# Patient Record
Sex: Female | Born: 1960 | Hispanic: No | Marital: Married | Smoking: Never smoker
Health system: Southern US, Community
[De-identification: ages and names within clinical notes are randomized; demographics above are authoritative.]

## PROBLEM LIST (undated history)

## (undated) HISTORY — PX: LUMBAR SPINE SURGERY: SHX701

---

## 2008-09-22 ENCOUNTER — Ambulatory Visit: Payer: Self-pay | Admitting: Sports Medicine

## 2008-09-22 DIAGNOSIS — M722 Plantar fascial fibromatosis: Secondary | ICD-10-CM

## 2008-09-22 DIAGNOSIS — M2170 Unequal limb length (acquired), unspecified site: Secondary | ICD-10-CM

## 2008-10-08 ENCOUNTER — Ambulatory Visit: Payer: Self-pay | Admitting: Sports Medicine

## 2008-10-08 DIAGNOSIS — M76899 Other specified enthesopathies of unspecified lower limb, excluding foot: Secondary | ICD-10-CM

## 2008-10-21 ENCOUNTER — Ambulatory Visit: Payer: Self-pay | Admitting: Sports Medicine

## 2008-11-12 ENCOUNTER — Ambulatory Visit: Payer: Self-pay | Admitting: Sports Medicine

## 2008-11-12 DIAGNOSIS — Q667 Congenital pes cavus, unspecified foot: Secondary | ICD-10-CM | POA: Insufficient documentation

## 2008-12-02 ENCOUNTER — Telehealth (INDEPENDENT_AMBULATORY_CARE_PROVIDER_SITE_OTHER): Payer: Self-pay | Admitting: *Deleted

## 2008-12-03 ENCOUNTER — Ambulatory Visit: Payer: Self-pay | Admitting: Sports Medicine

## 2008-12-15 ENCOUNTER — Telehealth: Payer: Self-pay | Admitting: Sports Medicine

## 2009-02-11 ENCOUNTER — Ambulatory Visit: Payer: Self-pay | Admitting: Sports Medicine

## 2009-02-11 DIAGNOSIS — M629 Disorder of muscle, unspecified: Secondary | ICD-10-CM

## 2009-02-11 DIAGNOSIS — M7540 Impingement syndrome of unspecified shoulder: Secondary | ICD-10-CM | POA: Insufficient documentation

## 2009-04-21 ENCOUNTER — Ambulatory Visit: Payer: Self-pay | Admitting: Sports Medicine

## 2009-04-21 DIAGNOSIS — R209 Unspecified disturbances of skin sensation: Secondary | ICD-10-CM | POA: Insufficient documentation

## 2009-04-21 DIAGNOSIS — M25529 Pain in unspecified elbow: Secondary | ICD-10-CM | POA: Insufficient documentation

## 2009-10-15 ENCOUNTER — Ambulatory Visit: Payer: Self-pay | Admitting: Sports Medicine

## 2009-10-15 DIAGNOSIS — M7742 Metatarsalgia, left foot: Secondary | ICD-10-CM

## 2009-10-15 DIAGNOSIS — M7741 Metatarsalgia, right foot: Secondary | ICD-10-CM

## 2009-12-01 ENCOUNTER — Encounter: Payer: Self-pay | Admitting: Sports Medicine

## 2009-12-07 ENCOUNTER — Encounter (INDEPENDENT_AMBULATORY_CARE_PROVIDER_SITE_OTHER): Payer: Self-pay | Admitting: *Deleted

## 2009-12-10 ENCOUNTER — Encounter: Payer: Self-pay | Admitting: Sports Medicine

## 2009-12-11 ENCOUNTER — Ambulatory Visit (HOSPITAL_BASED_OUTPATIENT_CLINIC_OR_DEPARTMENT_OTHER): Admission: RE | Admit: 2009-12-11 | Discharge: 2009-12-11 | Payer: Self-pay | Admitting: Orthopedic Surgery

## 2010-05-31 ENCOUNTER — Ambulatory Visit: Payer: Self-pay | Admitting: Sports Medicine

## 2010-05-31 DIAGNOSIS — M25539 Pain in unspecified wrist: Secondary | ICD-10-CM

## 2010-05-31 DIAGNOSIS — S83509A Sprain of unspecified cruciate ligament of unspecified knee, initial encounter: Secondary | ICD-10-CM

## 2010-06-28 ENCOUNTER — Encounter (INDEPENDENT_AMBULATORY_CARE_PROVIDER_SITE_OTHER): Payer: Self-pay | Admitting: *Deleted

## 2010-06-28 DIAGNOSIS — M5136 Other intervertebral disc degeneration, lumbar region: Secondary | ICD-10-CM | POA: Insufficient documentation

## 2010-06-30 ENCOUNTER — Encounter: Payer: Self-pay | Admitting: Sports Medicine

## 2010-07-12 ENCOUNTER — Ambulatory Visit
Admission: RE | Admit: 2010-07-12 | Discharge: 2010-07-12 | Payer: Self-pay | Source: Home / Self Care | Attending: Sports Medicine | Admitting: Sports Medicine

## 2010-07-26 ENCOUNTER — Encounter: Payer: Self-pay | Admitting: Sports Medicine

## 2010-07-27 NOTE — Miscellaneous (Signed)
Summary: ORTHO APPT  PT CALLED STATING SHE SAW ORTHO DOC IN [facility] AND WAS DX'ED WITH HIGH GRADE ACL; POST MED MENISCUS HORN TEAR; AND SMALL SPLIT PCL TEAR.  THEIR OFFICE COULD NOT SEE HER UNTIL JULY 28TH AND SHE WANTS SURGERY BEFORE THEN. PER FIELDS, PT CAN SEE DOC AT [facility]. PT NOW HAS APPT ON: THURS, JUNE 16TH AT 3 PM WITH DR Jonny Ruiz GRAVES AT 3 PM AT [facility]. PT AGREED WITH APPT TIME AND DATE

## 2010-07-27 NOTE — Assessment & Plan Note (Signed)
Summary: HIP PAIN,MC   Vital Signs:  Patient profile:   50 year old female Height:      64 inches Weight:      125 pounds BMI:     21.53 BP sitting:   97 / 63  Vitals Entered By: Lillia Pauls CMA (October 15, 2009 8:47 AM)  History of Present Illness: Pt presents for new complaint of right hip pain, h/o left hamstring tightness, and right elbow pain.  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  Patient denies new injury. States right hip posteriorly has been bothering her.  Worse with prolonged sitting, worse with stops and starts when playing tennis. Not running hills to bring this on.  No known inciting factors. Was doing some stretches (piriformis demonstrated) that helped initially but not feeling it now. Some numbness occasionally around area of pain. No bowel or bladder dysfunction. H/o lumbar spine surgery in 1980s for 'kissing spine' between L4-S1 Has been doing home exercises to strengthen abductors and flexors and doing well with these. Had left hamstring calcification proximally felt to be irritating sciatic nerve.  This side is better but still feels tight.  Is right handed Has known h/o medial epicondylitis - flareup prior to last visit and feels this is similar.  Allergies (verified): No Known Drug Allergies  Physical Exam  General:  alert, well-developed, and well-nourished.   Msk:  Walks without a limp.  R hip/back: No Gross deformity, swelling, bruising. No midline or paraspinal TTP.  No greater trochanter TTP.  Very mild TTP piriformis on right side. FROm hip and low back.  Negative log roll.  Negative SLRs. Strength 5/5 all muscle groups except 5-/5 with sartorius on right. Stretch felt in same area of pain with pretzel stretch and opposite knee to chest (r knee, left chest). Reflexes 2+ equal in patellar and achilles tendons. Sensation intact to light touch bilaterally.  Right Elbow: No bony abnormalities, edema or bruising. Well healed scar.  Full ROM  with flexion, extension, supination and pronation 5/5 strength with resisted flexion, extension, supination and pronation Mild medial epicondyle TTP Additional Exam:  U/S of the right prox hamstring/buttock showed no obvious muscle tears.  Small calcification without increased doppler flow near ischium.  Images saved for documentation.   Impression & Recommendations:  Problem # 1:  LEG PAIN, RIGHT (ICD-729.5) Assessment New More muscular in nature without obvious muscle tear by ultrasound.  Pretzel stretch and piriformis stretch to try to loosen this spasm up around sciatic nerve.  Standing hip rotations, lunges in various directions to help with strengthening.  Discussed dynamic exercises (yoga).    Problem # 2:  ELBOW PAIN, RIGHT (ICD-719.42) Assessment: Unchanged Exercises demonstrated.  Had discussed counterforce brace in past.  Activities as tolerated.  Complete Medication List: 1)  Lidoderm 5 % Ptch (Lidocaine) .... Apply patch to area up to 12 hours a day 2)  Tramadol Hcl 50 Mg Tabs (Tramadol hcl) .... Take one tab qid as needed pain 3)  Diclofenac Sodium 75 Mg Tbec (Diclofenac sodium) .... Take one tab two times a day 4)  Gabapentin 300 Mg Caps (Gabapentin) .... One tab by mouth qhs  Patient Instructions: 1)  Crossover, straight, and side lunges - 3 sets of 8 once a day. 2)  Knee to chest stretch 3 x 30 seconds once a day. 3)  Standing hip rotations 3 x 10 once a day. 4)  Pretzel stretch (leg off side of couch) 3 x 30 seconds once a day.  5)  Yoga would be a good idea - some classes for some more dynamic stretching. 6)  Hamstring try modified lunges - stright/ xover/ step outward- 7)  RT forearm 8)  rolls 9)  wrist extension - down slow and up fast 10)  tennis simulation w light weight

## 2010-07-27 NOTE — Consult Note (Signed)
Summary: [facility]  [facility]   Imported By: Marily Memos 12/23/2009 08:41:56  _____________________________________________________________________  External Attachment:    Type:   Image     Comment:   External Document

## 2010-07-27 NOTE — Assessment & Plan Note (Signed)
Summary: L WRIST PAIN,CHECK HER ORTHTOICS,MC   History of Present Illness: Hx of ACL surg - June 17-has been progressively increasing activities for the last 3-4 months. Now doing plyometrics and water aerobics. Has been semi-compliant with PT; going 2-3 times per month vs 2-3 times per week secondary to cost issues. Overall PT has been effective in leg function. did Pt once monthly Plantar Fasciitis: Pt reports recurrence in bilateral arch pain for last 2-3 months. Has been using custom orthotics with physical acitivities. However, orthotics do not fit non-athletic shoes which pt uses on more frequent basis. Pain/irritation R>L. No pain radiation to distal toes, distal parasthesias.  Wrist Pain: Pt reports L wrist pain over last 2-3 months. Pain most prominent w/ lateral hand movement with water exercises. Pain also prominent ulnar deviation. No noted finger numbness, or radiation of pain to lateral/medial elbow.   Allergies: No Known Drug Allergies  Physical Exam  General:  Well-developed,well-nourished,in no acute distress; alert,appropriate and cooperative throughout examination Msk:  L Wrist: no visible effusions, erythema. Full ROM w/ wrist flexion and extension. + pain w/ ulnar deviation. Intrinsic muscle function WNL.  no parasthesias noted  LE: + pronation bilaterally with standing Neck:  Full ROM w/ lateral movement.    RT  knee exam shows no effusion; stable ligaments; negative Mcmurray's and provocative meniscal tests; non painful patellar compression; patellar and quadriceps tendons unremarkable  LT in comparison shows scars from ACL surgery and actually has tighter lachman test than RT still has VMO atrophy and has marked hip abduction weakness  Orthotics look in good repair and support archs      Impression & Recommendations:  Problem # 1:  WRIST PAIN, LEFT (ICD-719.43)  will use wrist loop for water exercises  use at leat 6 weeks  see if less sxs over TFCC  area dound to have + ulnar variance by Dr Luiz Blare  Orders: Upper Limb Ortosis (wrist loop, arm band, finger splint) (Z6109)  Problem # 2:  ACL TEAR, LEFT KNEE (ICD-844.2) has made great progress  however needs to push HEP to build up strength in VMO and abductor  given program  Problem # 3:  FASCIITIS, PLANTAR (ICD-728.71)  Her updated medication list for this problem includes:    Diclofenac Sodium 75 Mg Tbec (Diclofenac sodium) .Marland Kitchen... Take one tab two times a day  With chronic probs keep up custom orthotics and add arch supports to other shoes  Complete Medication List: 1)  Lidoderm 5 % Ptch (Lidocaine) .... Apply patch to area up to 12 hours a day 2)  Tramadol Hcl 50 Mg Tabs (Tramadol hcl) .... Take one tab qid as needed pain 3)  Diclofenac Sodium 75 Mg Tbec (Diclofenac sodium) .... Take one tab two times a day 4)  Gabapentin 300 Mg Caps (Gabapentin) .... One tab by mouth qhs  Patient Instructions: 1)  Neck 2)  3x wk - isometrics exercise flex. extension, rotation and lat bend 3)  5 exercise x count of 10 4)  posture exercises 5)  LOW Back 6)  3 x week - pick 2 flexion and 2 extension 7)  2 Hip exercises as demo on sheet daily 8)  Knee daily 9)  SLR with foot rotated outward 10)  build that to 3 sets of 15 with 2lb ankle 11)  press downs 12)  Quad extensions with ball between legs 13)  6 weeks RTC   Orders Added: 1)  Est. Patient Level IV [60454] 2)  Upper Limb Ortosis (wrist  loop, arm band, finger splint) [L3999]

## 2010-07-29 NOTE — Miscellaneous (Signed)
Summary: XRAY ORDERS  Clinical Lists Changes  Problems: Added new problem of LUMBAGO (ICD-724.2) Orders: Added new Test order of Radiology other (Radiology Other) - Signed

## 2010-07-29 NOTE — Assessment & Plan Note (Signed)
Summary: POST XRAY,MC   Vital Signs:  Patient profile:   50 year old female Height:      64 inches Weight:      120 pounds BP sitting:   92 / 61  Vitals Entered By: Lillia Pauls CMA (July 12, 2010 9:43 AM)  History of Present Illness: Pt presents to clinic for follow up of low back pain post x-ray.  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 LBP radiates into left hip  when sitting will get tightness into both ant quads  Note seh had laminectomy in 1985 at L5/S1 Now more back pain w tennis as well  wrist pain - not a specific injury but thinks may relate to water aerobics teaching XRay by dr Luiz Blare showed + ulnar variance  ACL tear left feels strong now and is pain free no real loss of motion      Allergies: No Known Drug Allergies  Physical Exam  General:  Well-developed,well-nourished,in no acute distress; alert,appropriate and cooperative throughout examination Msk:  SI joints move well Rthip ER 60 and IR 30 Lthip ER 45 and IR 20  Neg straight leg raise RT FABER on Rt is tight FABER extremely tight on Lt Hip flexion strong bilat Hip abduction strong bilat Seated Hip rotation on rt 30 IR and 40 ER Seated Hip rotation Lt 15 IR and 40 ER Tenderness on palpation at L1-L2  Left hand atrophy hypothenar ridge ulnar side Pain over Lt TFCC Limited flexion of lt wrist Full extesion lt wrist    Additional Exam:  MSK [place] Left wrist is scanned All ext cmpts are normal x 6 There is persistent fluid and bull's eye noted around ECU tendon TFCC area is scanned The ligament is partially calcified I cannot detedt a tear in actual meniscus but this is tough to viz w US   Impression & Recommendations:  Problem # 1:  LUMBAGO (ICD-724.2)  Her updated medication list for this problem includes:    Tramadol Hcl 50 Mg Tabs (Tramadol hcl) .Marland Kitchen... Take one tab qid as needed pain    Diclofenac Sodium 75 Mg Tbec (Diclofenac sodium) .Marland Kitchen... Take one  tab two times a day  I think she needs to do a regular trial of tramadol and gabapentin to see if she gets less LBP and hip sxs Review of Xrays reveals signidicant loss of space and sclerosis at L5/S1 There is a large osteophyte and loss of disk space at L1/L2 as well some facet joint DDD noted lower lumbar  need to review if she has had any hip or pelvis films  suspect Sxs relate to DDD and DJD and as such will limit exercises that tend to worsen sxs Moderate tennis to tolerabel level and change overheaed and serve to put less stress on back  let me know how she respon ds to more reg med use  Problem # 2:  WRIST PAIN, LEFT (ICD-719.43)  Orders: Upper Limb Ortosis (wrist loop, arm band, finger splint) (J1914) [place] LIMITED ([id number])  I am concerned that she gets persistent irritation from + ulna good not prove or R/O TFCC injury but most pain at that location  review Xrays from DR GRaves  Problem # 3:  ACL TEAR, LEFT KNEE (ICD-844.2) This seems to have done very well  encouraged to see Graves one more time to see if any additional advice before competing at high level tennis  Complete Medication List: 1)  Lidoderm 5 % Ptch (  Lidocaine) .... Apply patch to area up to 12 hours a day 2)  Tramadol Hcl 50 Mg Tabs (Tramadol hcl) .... Take one tab qid as needed pain 3)  Diclofenac Sodium 75 Mg Tbec (Diclofenac sodium) .... Take one tab two times a day 4)  Gabapentin 300 Mg Caps (Gabapentin) .... One tab by mouth qhs  Patient Instructions: 1)  Find 2-3 stretches that make your low back feel better- ie- knee to chest, and back flexion - do these daily 2)  Take gabapentin 300 mg at bedtime 3)  Take tramadol twice daily, ok to supplement with aleve or advil 4)  Stop superman exercise    Orders Added: 1)  Upper Limb Ortosis (wrist loop, arm band, finger splint) [L3999] 2)  Est. Patient Level IV [52841] 3)  [place] LIMITED [32440]  Appended Document: POST XRAY,MC review of wrist Xray left   + ulnar variance with cystic change in ulnar styloid knees show mild medial sclerosis but good placement of ACL graft AP Pelvis - I do not see significant DJD of either hip/ mild changes at best  called and left message

## 2010-08-04 NOTE — Letter (Signed)
Summary: Generic Letter  [facility]  [address]   [place], [place] [address]   Phone: [phone]  Fax: [phone]    07/26/2010 [name], MD [facility] Fax [phone]   Dear Annette Stable:  Please see my patient:  Marie Hood [address] 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  She has a lot of ulnar side wrist pain in non dominant hand that hurts her mostly with water aerobics.  Dr Luiz Blare had seen her as well and found very + ulnar variance with cytic change at distal ulna on Xray.  She has not responded to conservative care so both he and I felt she would warrant consideration of wrist surgery.  I will send office notes and she will bring Xrays.  I very much appreciate your opinion regarding her care.          Sincerely,  Vincent Gros MD

## 2010-09-12 LAB — POCT HEMOGLOBIN-HEMACUE: Hemoglobin: 14.7 g/dL (ref 12.0–15.0)

## 2011-10-03 ENCOUNTER — Ambulatory Visit (INDEPENDENT_AMBULATORY_CARE_PROVIDER_SITE_OTHER): Payer: Managed Care, Other (non HMO) | Admitting: Sports Medicine

## 2011-10-03 ENCOUNTER — Encounter: Payer: Self-pay | Admitting: Sports Medicine

## 2011-10-03 VITALS — BP 98/66 | HR 66

## 2011-10-03 DIAGNOSIS — M23203 Derangement of unspecified medial meniscus due to old tear or injury, right knee: Secondary | ICD-10-CM | POA: Insufficient documentation

## 2011-10-03 DIAGNOSIS — S86819A Strain of other muscle(s) and tendon(s) at lower leg level, unspecified leg, initial encounter: Secondary | ICD-10-CM

## 2011-10-03 DIAGNOSIS — S838X9A Sprain of other specified parts of unspecified knee, initial encounter: Secondary | ICD-10-CM

## 2011-10-03 DIAGNOSIS — M79609 Pain in unspecified limb: Secondary | ICD-10-CM

## 2011-10-03 NOTE — Patient Instructions (Signed)
It was great to see you today! I want you to wear the knee brace while playing and for after playing. Warm up on either a stationary bike or using some straight leg raises.

## 2011-10-03 NOTE — Progress Notes (Signed)
Patient ID: Marie Hood, female   DOB: 1960/07/11, 51 y.o.   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  Pt reports that she does not typically do any type of distance running.  She decided to do 5K at the urging of her daughter. She ran a 5K approximately 9 days ago and then proceeded to play tennis later that day and the following day. She reports that the day following the race she awoke with primarily right knee pain and swelling. The pain was located behind her knee and on the medial side. It was made worse by straightening her knee. She has continued most of her normal activities despite the discomfort and has been icing it intermittently since then. She continues to have problems with pain and swelling today, although it is somewhat better than a week ago.  Objective: General: Appropriate white female, in no distress Extremities: On inspection there is no obvious asymmetry between the knees. There is a slight amount of swelling in the left medial posterior knee but otherwise there are no obvious joint effusions. Right hip: 130 degrees of flexion, full internal/external rotation Left hip: 120 degrees flexion, full internal/external rotation Left Knee: Full extension, 135 degrees flexion, no joint instability, good MCL/LCL strength Right Knee: Full Extension, 135 degrees flexion, no joint instability, good MCL/LCL strength, small amount of swelling posteriorly in the popliteal fossa and slight tenderness over the medial joint line.  Assessment/Plan: Medial meniscus bruise from overuse/running on hard surface.  Will treat with full knee brace, ice and light duty for the next 1-2 weeks.

## 2013-07-29 ENCOUNTER — Ambulatory Visit (INDEPENDENT_AMBULATORY_CARE_PROVIDER_SITE_OTHER): Payer: Managed Care, Other (non HMO) | Admitting: Family Medicine

## 2013-07-29 ENCOUNTER — Encounter: Payer: Self-pay | Admitting: Family Medicine

## 2013-07-29 VITALS — BP 92/61 | Ht 64.0 in | Wt 120.0 lb

## 2013-07-29 DIAGNOSIS — M1610 Unilateral primary osteoarthritis, unspecified hip: Secondary | ICD-10-CM

## 2013-07-29 DIAGNOSIS — M629 Disorder of muscle, unspecified: Secondary | ICD-10-CM

## 2013-07-29 DIAGNOSIS — M169 Osteoarthritis of hip, unspecified: Secondary | ICD-10-CM

## 2013-07-29 DIAGNOSIS — M259 Joint disorder, unspecified: Secondary | ICD-10-CM

## 2013-07-29 NOTE — Progress Notes (Signed)
   Subjective:    Patient ID: Margurite Auerbachanette Moorefield, female    DOB: 03/27/1961, 53 y.o.   MRN: [id number]  HPI  3 issues #1. Left knee pain that started to half days ago while playing tennis. Felt a sudden "ping or zing" in left anterior knee. Painful. Did not fall, knee did not give way. Did not have significant swelling but has noticed one small area of swelling about the size of a quarter on her anterior left knee. Is bothering her a little bit and she's concerned because she has an upcoming national tennis term PERTINENT  PMH / PSH: Prior surgery left knee anterior cruciate ligament repair with patellar tendon transfer. 2011 with Dr. Luiz BlareGraves. Nonsmoker   #2. Right knee has been hurting off and on over the last couple of weeks, anterior medial. It stings and has a tingly feeling there is sometimes painful. No swelling, no locking or giving way. No specific injury. #3. Left hip pain. This has been going on for some time. Has been doing L. the etc. to keep her self flexible. Has noticed that her external rotation on the left hip his now fairly limited and has questions about what she should do to prevent it from worsening. Interestingly on her right hip for internal rotation is an issue  Review of Systems No fever, sweats, chills, unusual weight change.    Objective:   Physical Exam  Vital signs are reviewed GENERAL: Well-developed female no acute distress Hips: Limited external rotation left hip to about 30. Right hip externally rotates fully but in general rotation is limited to about 20. Flexion is full at his extension bilaterally. Greater trochanters are nontender.  Knees: Knees are symmetrical. There is no effusion, erythema or warmth noted of either. Distally bilateral calves are soft and she is neurovascularly intact status. Left knee right at the joint line laterally she has a small palpable nickel-sized reducible mass. Not particularly tender. Lachman's is a little tighter on the left where  she had surgery but bilaterally quite stable in points. Negative McMurray. Negative apprehension sign. No true joint line tenderness on either knee. ULTRASOUND: Both knees are evaluated and had normal patellar and quadriceps tendons. Medial and lateral menisci are seen fairly well and appeared to be intact without any tears or effusion. The left knee lateral retinaculum right over the articular surface reveals a small tear with some herniation of fat through the tear. There some increased Doppler activity over this area and it coincides with the small nickel sized deformity.      Assessment & Plan:  #1. Knee pain. She has a small joint capsule tear lateral retinaculum left knee. I think if she uses her knee brace and does some icing she can continue to play. Right knee appears normal so I suspect her pain is maybe some mild DJD. Some this may be related to her hip issues. #2. Hips: Likely DJD with fairly significant red loss of external rotation on the left and internal rotation on the right. We talked about continuing to do flexibility exercises.

## 2014-03-11 ENCOUNTER — Ambulatory Visit (INDEPENDENT_AMBULATORY_CARE_PROVIDER_SITE_OTHER): Payer: Managed Care, Other (non HMO) | Admitting: Sports Medicine

## 2014-03-11 ENCOUNTER — Encounter: Payer: Self-pay | Admitting: Sports Medicine

## 2014-03-11 VITALS — BP 99/67 | HR 64 | Ht 64.0 in | Wt 120.0 lb

## 2014-03-11 DIAGNOSIS — R269 Unspecified abnormalities of gait and mobility: Secondary | ICD-10-CM

## 2014-03-11 DIAGNOSIS — R2689 Other abnormalities of gait and mobility: Secondary | ICD-10-CM

## 2014-03-11 DIAGNOSIS — M23305 Other meniscus derangements, unspecified medial meniscus, unspecified knee: Secondary | ICD-10-CM

## 2014-03-11 DIAGNOSIS — M23203 Derangement of unspecified medial meniscus due to old tear or injury, right knee: Secondary | ICD-10-CM

## 2014-03-11 NOTE — Progress Notes (Signed)
  Marie Hood - 53 y.o. female MRN [id number]  Date of birth: Aug 27, 1960  SUBJECTIVE:  Including CC & ROS.  The patient is a very pleasant 53 year old Caucasian female who presents today for right knee pain and new orthotics to replace her broken down orthotics. 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 She reports over the past few weeks her right knee pain has progressively gotten worse although it has been present for several years. She has difficulty doing any kind of deep squats or lunges. Localizes it to the medial jointline. Has decreased her tennis activities from 3 days a week to once a week. She's been doing therapeutic modality such as body helix knee sleeve and icing and occasional over-the-counter Aleve. 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 She denies any locking catching or giving way. But does complain of occasional effusion particularly after aggressive.   ROS: Review of systems otherwise negative except for information present in HPI  HISTORY: Past Medical, Surgical, Social, and Family History Reviewed & Updated per EMR. Pertinent Historical Findings include: Non-smoker  DATA REVIEWED: No other data available  PHYSICAL EXAM:  VS: BP:99/67 mmHg  HR:64bpm  TEMP: ( )  RESP:   HT:5\' 4"  (162.6 cm)   WT:120 lb (54.432 kg)  BMI:20.6 KNEE EXAM:  General: well nourished Skin of LE: warm; dry, no rashes, lesions, ecchymosis or erythema. Vascular: Dorsal pedal pulses 2+ bilaterally Neurologically: Sensation to light touch lower extremities equal and intact bilaterally.  Observation: no knee effusion visualized, no previous surgical scars, patella in place, no quad muscle atrophy Palpation: moderate TTP along the medial joint line at the mid to posterior portion.  No pain along the lateral joint line Range of motion: Full ROM at the hip and very good strength in hip ADB/ ER Full knee flexion to approximately 140, full extension 0,  normal patellar tracking Ligamentous testing: ligamentous stable  Negative patella apprehension test Meniscal evaluation: Positive McMurray's test, positive thessaly's test, Normal gait  MSK [place]: Patient has a normal patella tendon. There is a small area of calcification within the quadriceps tendon with no history of discomfort. Negative for evidence of knee effusion on ultrasound exam. The medial meniscus particularly in the posterior aspect has some hypoechoic changes and splitting within the meniscus concerning for degenerative tear.  ASSESSMENT & PLAN: See problem based charting & AVS for pt instructions. Right knee degenerative medial meniscus tear: Based on the patient's history, exam, and ultrasound exam she is evidence of a medial right knee degenerative meniscus tear. Patient was given options of continuing conservative management with knee sleeve, and over-the-counter anti-inflammatories, staying active, and activity such as biking versus more aggressive activities such as x-raying or cortisone injection. Age-related continue conservative management.  Orthotic Fitting and Adjustment note: Patient was fitted for a : standard, cushioned, semi-rigid orthotic.  The orthotic was heated and afterward the patient stood on the orthotic blank positioned on the orthotic stand.  The patient was positioned in subtalar neutral position and 10 degrees of ankle dorsiflexion in a weight bearing stance.  After completion of molding, a stable base was applied to the orthotic blank.  The blank was ground to a stable position for weight bearing.  Size: 9 Base: Blue EVA Posting: None Additional orthotic padding: None  Greater than 50% of the patient's visit was conducted with face-to-face counseling for bilateral foot orthotics fitting and constructing

## 2014-03-11 NOTE — Patient Instructions (Signed)
It was great to see you today! Call the office if you have any questions [phone]  Avoid squatting and lunges type activities any greater than 30-45 deg

## 2014-04-01 ENCOUNTER — Encounter: Payer: Self-pay | Admitting: Sports Medicine

## 2014-04-01 ENCOUNTER — Ambulatory Visit (INDEPENDENT_AMBULATORY_CARE_PROVIDER_SITE_OTHER): Payer: Managed Care, Other (non HMO) | Admitting: Sports Medicine

## 2014-04-01 ENCOUNTER — Encounter: Payer: Managed Care, Other (non HMO) | Admitting: Sports Medicine

## 2014-04-01 VITALS — Ht 64.0 in | Wt 120.0 lb

## 2014-04-01 DIAGNOSIS — M79672 Pain in left foot: Secondary | ICD-10-CM

## 2014-04-01 DIAGNOSIS — M79671 Pain in right foot: Secondary | ICD-10-CM

## 2014-04-01 NOTE — Patient Instructions (Signed)
It was great to see you today! Call the office if you have any questions [phone]  If you have problems with your orthotics, please let us know.

## 2014-04-01 NOTE — Progress Notes (Signed)
  Marie Hood - 53 y.o. female MRN [id number]  Date of birth: 04/02/1961  SUBJECTIVE:  Including CC & ROS.  The patient is a very pleasant 53 year old Caucasian female who presents today for right knee pain and new orthotics specifically for her work shoes.  Pt is very active, performing activities such as tennis, personal training, and she is currently working 25-30 hours at [employer] to work.  Pt has been performing water aerobics more often to help with her medial knee pain, at which there is a concern about a degenerative medial meniscal tear.    HX of plantar fasiitis that responded to orthotics and injection HX of chronic low back pain and this has really led to her using orthotics daily as she gets back pain when she goes without them for a few hours ACL tear left repaired   ROS: Review of systems otherwise negative except for information present in HPI  HISTORY: Past Medical, Surgical, Social, and Family History Reviewed & Updated per EMR. Pertinent Historical Findings include: Non-smoker  DATA REVIEWED: No other data available  PHYSICAL EXAM:  VS: BP:   HR: bpm  TEMP: ( )  RESP:   HT:5\' 4"  (162.6 cm)   WT:120 lb (54.432 kg)  BMI:20.6 KNEE EXAM:  General: well nourished Skin of LE: warm; dry, no rashes, lesions, ecchymosis or erythema. Vascular: Dorsal pedal pulses 2+ bilaterally Neurologically: Sensation to light touch lower extremities equal and intact bilaterally.  Foot Exam:  B/L Pes Cavus, R>L  R Foot:  Splaying of toes 2-5 with collapse of the transverse arch Morton's callous present Calcaneal Varus   L Foot: 2nd Dorsum TMT Bossing  Collapse of the transverse arch, splaying of toes 2-5 Morton's callous Calcaneal Varus    ASSESSMENT & PLAN: See problem based charting & AVS for pt instructions. Foot Pain, B/L   Orthotic Fitting and Adjustment note: Patient was fitted for a : rigid, metal based orthotic  The orthotic was heated and afterward the patient  stood on the orthotic blank positioned on the orthotic stand.  The patient was positioned in subtalar 10 degrees of supination position and 10 degrees of ankle dorsiflexion in a weight bearing stance.  After completion of molding, a stable base was applied to the orthotic blank.  The blank was ground to a stable position for weight bearing.  Size: 9 Base: Red EVA  Posting: Lateral  Additional orthotic padding: Blue EVA at the heel   Greater than 50% of the patient's 40 minute visit was conducted with face-to-face counseling for bilateral foot orthotics fitting and constructing

## 2014-10-02 ENCOUNTER — Encounter: Payer: Self-pay | Admitting: Sports Medicine

## 2014-10-02 ENCOUNTER — Ambulatory Visit (INDEPENDENT_AMBULATORY_CARE_PROVIDER_SITE_OTHER): Payer: BLUE CROSS/BLUE SHIELD | Admitting: Sports Medicine

## 2014-10-02 VITALS — BP 94/51 | HR 67 | Ht 64.0 in | Wt 120.0 lb

## 2014-10-02 DIAGNOSIS — M7741 Metatarsalgia, right foot: Secondary | ICD-10-CM | POA: Diagnosis not present

## 2014-10-02 DIAGNOSIS — M7742 Metatarsalgia, left foot: Secondary | ICD-10-CM | POA: Diagnosis not present

## 2014-10-02 DIAGNOSIS — M75101 Unspecified rotator cuff tear or rupture of right shoulder, not specified as traumatic: Secondary | ICD-10-CM

## 2014-10-02 DIAGNOSIS — M7541 Impingement syndrome of right shoulder: Secondary | ICD-10-CM

## 2014-10-02 NOTE — Assessment & Plan Note (Signed)
She will continue with custom orthotics  Related metatarsal padding to both  Related foam padding across metatarsal fat pad area  This was more comfortable with walking and she will test over the next month

## 2014-10-02 NOTE — Assessment & Plan Note (Signed)
This relates to lots of years tennis  I suggested postural exercises Scapular stabilization exercises Keep up rotator cuff strength  Reevaluate if not improving

## 2014-10-02 NOTE — Progress Notes (Signed)
Patient ID: Marie Hood, female   DOB: 05/15/1961, 54 y.o.   MRN: [id number]  Pt with foot pain worse on left We have her in custom orthotics for tennis and a dress orthotic for work She gets significant left forefoot pain The orthotics have helped her overall foot pain in her previous problems with plantar fasciitis She has a cavus foot  Another issue is some chronic neck and right shoulder pain This will trigger a tension headache  She plays a lot of tennis and works on her feet at [employer] about 6 hours a day  Physical exam Muscular female in no acute distress BP 94/51 mmHg  Pulse 67  Ht 5\' 4"  (1.626 m)  Wt 120 lb (54.432 kg)  BMI 20.59 kg/m2  Compared to prior visit she continues to have a cavus foot but has more callusing in the forefoot area of the left foot On right foot she has callus right over the fourth metatarsal head Longitudinal arch is preserved Transverse arch is showing flattening  Neck range of motion was full and nonpainful Right trapezius showed spasm Shoulder with full range of motion  her right shoulder has a positive empty can in a positive Hawkins test

## 2014-10-22 ENCOUNTER — Ambulatory Visit: Payer: Managed Care, Other (non HMO) | Admitting: Sports Medicine

## 2017-06-13 ENCOUNTER — Ambulatory Visit: Payer: BLUE CROSS/BLUE SHIELD | Admitting: Sports Medicine

## 2017-06-13 ENCOUNTER — Encounter: Payer: Self-pay | Admitting: Sports Medicine

## 2017-06-13 DIAGNOSIS — Q667 Congenital pes cavus, unspecified foot: Secondary | ICD-10-CM

## 2017-06-13 DIAGNOSIS — R2689 Other abnormalities of gait and mobility: Secondary | ICD-10-CM | POA: Diagnosis not present

## 2017-06-13 DIAGNOSIS — M7741 Metatarsalgia, right foot: Secondary | ICD-10-CM

## 2017-06-13 DIAGNOSIS — M7742 Metatarsalgia, left foot: Secondary | ICD-10-CM

## 2017-06-13 NOTE — Assessment & Plan Note (Signed)
Has been in orthotics with good success for 10 yrs +  Needs to cont. These long term

## 2017-06-13 NOTE — Assessment & Plan Note (Signed)
She will test using MT pad on 1 orthotic and none on other  If not needed we may be able to stop pads

## 2017-06-13 NOTE — Progress Notes (Signed)
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 The orthotic was heated and afterward the patient stood on the orthotic blank positioned on the orthotic stand. The patient was positioned in subtalar neutral position and 10 degrees of ankle dorsiflexion in a weight bearing stance. After completion of molding, a stable base was applied to the orthotic blank. The blank was ground to a stable position for weight bearing. Size: 9 Red EVA Base: blue EVA mediums Posting: none - we held MT pads as the cushion felt good Additional orthotic padding: O  Patient was fitted for a : standard, cushioned, semi-rigid orthotic. The orthotic was heated and afterward the patient stood on the orthotic blank positioned on the orthotic stand. The patient was positioned in subtalar neutral position and 10 degrees of ankle dorsiflexion in a weight bearing stance. After completion of molding, a stable base was applied to the orthotic blank. The  blank was ground to a stable position for weight bearing. Size: 9 Thin "dress orthotic" Base: tan medial to lateral taper EVA mod Posting: MT pad on left Additional orthotic padding:O  After completion of these 2 pairs of orthotics she was comfortable walking in each pain Good control of foot positon Able to run with no forefoot pain in cusioned orthotic

## 2017-06-13 NOTE — Assessment & Plan Note (Signed)
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

## 2017-08-15 ENCOUNTER — Encounter: Payer: Self-pay | Admitting: Sports Medicine

## 2017-08-15 ENCOUNTER — Ambulatory Visit
Admission: RE | Admit: 2017-08-15 | Discharge: 2017-08-15 | Disposition: A | Discharge status: Home / Self Care | Payer: BC Managed Care – PPO | Source: Ambulatory Visit | Attending: Sports Medicine | Admitting: Sports Medicine

## 2017-08-15 ENCOUNTER — Ambulatory Visit: Payer: BC Managed Care – PPO | Admitting: Sports Medicine

## 2017-08-15 VITALS — BP 94/60 | Ht 64.0 in | Wt 118.0 lb

## 2017-08-15 DIAGNOSIS — M5030 Other cervical disc degeneration, unspecified cervical region: Secondary | ICD-10-CM | POA: Diagnosis not present

## 2017-08-15 DIAGNOSIS — M542 Cervicalgia: Secondary | ICD-10-CM | POA: Diagnosis not present

## 2017-08-15 DIAGNOSIS — G8929 Other chronic pain: Secondary | ICD-10-CM | POA: Diagnosis not present

## 2017-08-15 DIAGNOSIS — M5136 Other intervertebral disc degeneration, lumbar region: Secondary | ICD-10-CM | POA: Diagnosis not present

## 2017-08-15 DIAGNOSIS — M51369 Other intervertebral disc degeneration, lumbar region without mention of lumbar back pain or lower extremity pain: Secondary | ICD-10-CM

## 2017-08-15 DIAGNOSIS — M545 Low back pain, unspecified: Secondary | ICD-10-CM

## 2017-08-15 MED ORDER — CYCLOBENZAPRINE HCL 10 MG PO TABS: 10.0000 mg | ORAL_TABLET | Freq: Every evening | ORAL | 2 refills | Status: DC | PRN

## 2017-08-15 NOTE — Progress Notes (Signed)
Subjective:    Patient ID: Marie Hood, female    DOB: 04/30/1961, 57 y.o.   MRN: [id number]  HPI  Marie Hood is a 57yo F presenting with R neck, hamstring, SI joint, and foot pain.   Patient says symptoms have been present intermittently for years, but have recently been worsening and impacting her ability to play tennis. 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 Neck pain has been present since patient was about 57yo. She has had a neck XR in the past which she thinks only showed arthritis. Also has a history of lower back pain for which she received laminectomy in the 1980s. Has been taking Aleve for her pains which has not been helpful. Has also iced certain areas, primarily hip and SI joint, which helps some. Massage has also been somewhat helpful. Pain worsens with driving and sitting for prolonged periods of time, and she also has a lot of pain after playing tennis.  Patient describes neck pain as located only on R and feeling very tight. She has been stretching her neck frequently which has not helped. Pain is so severe at times that she develops what she feels to be associated headaches. Describes the R hamstring pain as also feeling very tight and limiting her ROM. She localized foot pain to area below lateral malleolus. This pain also is worse after activity. This is the least severe of her current pains.   Review of Systems MSK: Denies numbness, weakness, or tingling of R upper extremity or R lower extremity.     Objective:   Physical Exam  Constitutional: She is oriented to person, place, and time. She appears well-developed and well-nourished. No distress.  HENT:  Head: Normocephalic and atraumatic.  Musculoskeletal:  Neck: 5 degree flexion contraction of neck to R, no forward neck tilt noted. Limited neck ROM, primarily with lateral neck flexion to R. Good neck extension and flexion otherwise.  Back: Hemihypertrophy of R  trapezius. Mild scoliosis. Significantly limited back extension.  Upper extremities: Significant glenohumeral internal rotation deficit (~30 degrees) of RUE. Full ROM of LUE.  Hip/lower extremities: Tight FABER bilaterally, R>L. Lack of internal rotation and flexion of L hip. Weakness of R hamstring (4/5). TTP of hamstring at insertion at ischial tuberosity.   Neurological: She is alert and oriented to person, place, and time.  Psychiatric: She has a normal mood and affect. Her behavior is normal.   + H test on RT + weakness on 1 leg dive test  XR of Cervical spine Reviewed and has signifciant DDD of C5/6 and C6/7 Also noted facet joint sclerosis RT > Lt  XR of lumbar spine Reviewed by me and shows significant loss of L5/S1 disk space Spurring noted SI joints are normal    Assessment & Plan:  R-sided pain No obvious etiology of various pains noted on exam today, however patient with multiple physical exam abnormalities, including limited ROM of neck, tight FABER bilaterally, weakness of R hamstring, and lack of internal rotation and flexion of L hip. Given patient's history of neck pain beginning at a young age as well as back pain requiring surgical procedure in 1980s, concerned that patient may have a congenital pelvic rotational abnormality causing/contributing to her pains. Patient's very active lifestyle of playing tennis and working as Systems analyst also possibly contributing to her symptoms. Will focus initially on improving patient's ROM, primarily in her neck. Will begin Flexeril 10mg  qhs  for this. Will also work on lower extremity strengthening exercises, which were demonstrated to patient today. Will obtain C spine as well as LS spine plain films to rule out obvious bony abnormalities as possible etiology.   Tarri AbernethyAbigail J Lancaster, MD, MPH PGY-3 [facility] Pager [phone]  Addendum: after revioew of XR changes likely source of most issues is: DDD CX spine DDD  L5/S1 of lumbar spine  I observed and examined the patient with the resident and agree with assessment and plan.  Note reviewed and modified by me. Enid BaasKarl Fields

## 2017-08-15 NOTE — Assessment & Plan Note (Signed)
XR confirms L5/S1 DDD  We discussed strategies to work on low back Also will work on Hip rotation Suspect Hamstring issue is secondary and she needs to work HS strength  Flexeril 10 hs  Reck 1 month

## 2017-08-15 NOTE — Assessment & Plan Note (Addendum)
We will start easy ROM Isometrics in 4 planes Self traction - she may try her inversion table  F/u 4 wks  I spent 30 minutes with this patient. Over 50% of visit was spend in counseling and coordination of care for problems with tightness and radiating symptoms from both cervical and lumbar spine.

## 2017-09-19 ENCOUNTER — Encounter: Payer: Self-pay | Admitting: Sports Medicine

## 2017-09-19 ENCOUNTER — Ambulatory Visit (INDEPENDENT_AMBULATORY_CARE_PROVIDER_SITE_OTHER): Payer: BC Managed Care – PPO | Admitting: Sports Medicine

## 2017-09-19 VITALS — BP 106/36 | Ht 64.0 in | Wt 120.0 lb

## 2017-09-19 DIAGNOSIS — G5701 Lesion of sciatic nerve, right lower limb: Secondary | ICD-10-CM

## 2017-09-19 DIAGNOSIS — M5137 Other intervertebral disc degeneration, lumbosacral region: Secondary | ICD-10-CM

## 2017-09-19 DIAGNOSIS — M5030 Other cervical disc degeneration, unspecified cervical region: Secondary | ICD-10-CM

## 2017-09-19 MED ORDER — METHYLPREDNISOLONE ACETATE 40 MG/ML IJ SUSP
40.0000 mg | Freq: Once | INTRAMUSCULAR | Status: AC
  Administered 2017-09-19: 40 mg via INTRA_ARTICULAR

## 2017-09-19 NOTE — Progress Notes (Signed)
Chief complaint: Follow-up of chronic right hip and gluteal pain  History of present illness: Marie Hood is a 57 year old female who presents to sports medicine office today for follow-up of chronic right hip and gluteal pain.  She was here last about a little over a month ago back on 08/15/17.  She presented at that time with multiple issues to include right neck, right hamstring, and right SI joint pain.  X-rays of her cervical spine and lumbar spine were obtained, which did show multilevel degenerative disc disease of the cervical spine, specifically involving C5 through 7, as well as lumbar degenerative disc disease involving L5-S1.  Main concern today is right gluteal pain.  She points to the piriformis on the right side as to where she feels the pain.  She reports sitting and laying on that side are main aggravating factors.  She does not report of any anterior or lateral hip pain.  She reports that she has been doing exercises at home which has helped out slightly.  She reports that the Flexeril that was prescribed previously has not made any difference in her symptoms other than making her feel groggy.  It was noted last time that she did have tightness in her right hip specifically with FABER, had some weakness in her right hamstrings.  She has not been using any other medications for pain.    ROS She does not report of any numbness, tingling, or burning paresthesias radiating down her right leg to right foot and ankle.  She does not report of any bowel or bladder incontinence, does not report of any saddle anesthesia.  She does not report of any fevers, chills, night sweats, or any unintentional weight loss.  She does not report generalized weakness in the right lower extremity.  Review of systems:  As stated above  Interval past medical history, surgical history, family history, and social history obtained and unchanged.  Past medical history notable for migraines and hypothyroidism; She does not  report of any surgeries; She does not report of any current tobacco use; Family history unremarkable for hypertension or type 2 diabetes; Allergies and medications reviewed, are reflected in EMR.  Physical exam: Vital signs are reviewed and are documented in the chart Gen.: Alert, oriented, appears stated age, in no apparent distress HEENT: Moist oral mucosa Respiratory: Normal respirations, able to speak in full sentences Cardiac: Regular rate, distal pulses 2+ Integumentary: No rashes on visible skin:  Neurologic: Her strength with hip flexion, hip abduction, hip adduction, quadriceps, hamstring strength on both sides are intact, would categorize strength as 5/5, sensation 2+ in bilateral lower extremities Psych: Normal affect, mood is described as good Musculoskeletal: Inspection of right hip reveals no deformity or muscle atrophy, no warmth, erythema, ecchymosis, or effusion, she is actually specifically tender to palpation at the musculotendinous junction of the piriformis muscle, no tenderness to palpation over the anterior hip, lateral hip over the greater trochanteric bursal region, no tenderness to palpation over the ischial tuberosity and proximal hamstrings, with piriformis stretching and piriformis scratch test pain is reproduced, straight leg, FABER, FADIR, and logroll negative, she does have notable tightness in the right hip with FABER testing, she does have normal hip internal and external range of motion bilaterall  Procedure note: After written informed consent signed and obtained, and benefit of pain relief and risk of bleeding, infection, and steroid flare discussed, Marie Hood  agreed to proceed with cortisone injection into the right piriformis. After timeout, area  was cleaned with Betadine  and alcohol wipes. Ethyl chloride was used as topical anesthetic spray. Using 4 cc 1% lidocaine without epinephrine and 1 cc of 40 mg Depo-Medrol on a 25-gauge 1.5 inch needle, the right  piriformis at the musculotendinous injection was injected. She did not have any bleeding afterwards. No complications noted from procedure. Kb Darrick PennaFields, MD  Assessment and plan: 1. Right piriformis syndrome 2. Lumbar degenerative disc disease, with narrowing at L5-S1, currently asymptomatic 3. Multi-level cervical degenerative disc disease affecting C5-C7, currently asymptomatic  Plan: Discussed with Marie Hood today that most likely the pain that she is having in the right buttocks region is consistent with right piriformis syndrome.  Despite doing physical therapy and hamstring exercises at home, she is not having any improvement.  Unfortunately the Flexeril is not providing any relief for her.  Discussed option of cortisone injection into the piriformis muscle today.  She is agreeable to this.  Injection done as noted above without any complications noted.   Ultimately, do feel that there are a few things that are predisposing her to this issue.  Discussed that her rotation in tennis swinging the racquet can aggravate the piriformis muscle.  She also has more of a pelvic internal rotation that does limit piriformis stretching and does increase piriformis tightness.  Do not suspect she would necessarily have gross weakness in the piriformis, given the anatomy as noted above. Essentially, it makes it where the piriformis never relaxes.  Discussed OB history, she was not able to have SVD, had to have C-Section.  Will have her continue with the exercises at home.  She will call in a few weeks if she is no better.  Otherwise, she will follow-up on as-needed basis.   Haynes Kernshristopher Lake, M.D. Primary Care Sports Medicine Fellow [facility]  I observed and examined the patient with the [facility] fellow and performed the injection.  I agree with assessment and plan.  Note reviewed and modified by me. Enid BaasKarl Lynzi Meulemans, MD

## 2017-09-20 ENCOUNTER — Ambulatory Visit: Payer: BC Managed Care – PPO | Admitting: Family Medicine

## 2017-09-28 ENCOUNTER — Ambulatory Visit: Payer: BC Managed Care – PPO | Admitting: Sports Medicine

## 2017-10-24 ENCOUNTER — Encounter: Payer: Self-pay | Admitting: Sports Medicine

## 2017-10-24 ENCOUNTER — Ambulatory Visit (INDEPENDENT_AMBULATORY_CARE_PROVIDER_SITE_OTHER): Payer: BC Managed Care – PPO | Admitting: Sports Medicine

## 2017-10-24 DIAGNOSIS — M5136 Other intervertebral disc degeneration, lumbar region: Secondary | ICD-10-CM | POA: Diagnosis not present

## 2017-10-24 DIAGNOSIS — M76899 Other specified enthesopathies of unspecified lower limb, excluding foot: Secondary | ICD-10-CM

## 2017-10-24 NOTE — Assessment & Plan Note (Signed)
With hamstring syndrome I suggested returning to exercise series Diver  extender Gli If she is not improving after 3 weeks of exercises Consider adding low-dose gabapentin 300 mg at night  She will have to use cushion for seating  Reck 6 wks

## 2017-10-24 NOTE — Progress Notes (Signed)
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 In February she has some chronic low back pain without sciatica She has L5-S1 degenerative disc disease  In March she had been having some radiating pain into her piriformis Injection of this helped temporarily and the piriformis pain actually improved enough to play tennis However she has developed persistent pain right over her ischial tuberosity This is particularly worse with sitting and driving Of importance is that she had stopped her hamstring exercises earlier because of her low back pain  Review of systems Some burning into the right but no true sciatica No real weakness  Physical examination Muscular female in no acute distress BP 96/68   Ht  (1.626 m)   Wt 118 lb (53.5 kg)   BMI 20.25 kg/m   She has good hamstring and quadriceps strength Her hip motion is very tight as noted before However hip rotation is equal Low back is flat but nontender Normal appearance of the hamstring muscles Tenderness to palpation at the ischial tuberosity Pain at the end range of the extender and diver exercise  Ultrasound At the initial tuberosity there is some minor amount of calcific spurring There is some irregularity of the tuberosity on transverse scan Hamstring tendons appear normal There is some hyperechoic scar tissue over the semimembranosus tendon No sign of bursal swelling No evidence of a tear Impression Findings consistent with high hamstring syndrome with no evidence of a tear Ultrasound and interpretation by Sibyl Parr. Darrick Penna, MD

## 2017-10-24 NOTE — Assessment & Plan Note (Signed)
Suspect this is contributing to difficulty with recurrent HS issues Keep up LB exercise regimen

## 2017-11-06 ENCOUNTER — Encounter

## 2017-11-21 ENCOUNTER — Ambulatory Visit: Payer: BC Managed Care – PPO | Admitting: Sports Medicine

## 2017-11-21 DIAGNOSIS — M25572 Pain in left ankle and joints of left foot: Secondary | ICD-10-CM | POA: Diagnosis not present

## 2017-11-21 NOTE — Progress Notes (Signed)
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 She has had some past ankle sprains She has significantly high arches Orthotics have helped this The day following playing 3 days ago while standing she felt a sudden sharp pain in her lateral left ankle She had some significant swelling with this She has received some manual therapy which seems to have helped She did not feel that she had twisted or turned the ankle  With some persistent pain she comes for evaluation as she is scheduled to play a tennis tournament in 3 days  Review of systems Chronic hamstring pain doing somewhat better Hip tightness bilaterally Sciatica on the right that is intermittent  Physical examination Muscular female in no acute distress BP 110/70   Ht  (1.626 m)   Wt 118 lb (53.5 kg)   BMI 20.25 kg/m   Left ankle shows full range of motion There is some mild pain on inversion and plantarflexion There is no visible swelling There is some tenderness to palpation just below the lateral malleolus Peroneal muscle strength testing seems good Cavus shape foot She does have a positive anterior drawer but other ligaments seem stable  Ultrasound of left ankle There is a small defect in the peroneus brevis tendon that is hypoechoic just below the lateral malleolus There is significant hypoechoic swelling that tracks along the Peroneus brevis No ankle effusion No other significant abnormalities  Impression is a small partial tear of the Peroneus brevis tendon  Ultrasound and interpretation by Royal Hawthorn B. Darrick Penna, MD

## 2017-11-22 ENCOUNTER — Encounter: Payer: Self-pay | Admitting: Sports Medicine

## 2017-11-22 DIAGNOSIS — M25572 Pain in left ankle and joints of left foot: Secondary | ICD-10-CM | POA: Insufficient documentation

## 2017-11-22 NOTE — Assessment & Plan Note (Signed)
Seems like an attritional tendon injury from playing a lot of tennis  Use ankle compression for the next 6 weeks Use 1 foot balance exercises Moderate activity to pain and swelling Recheck after 6 weeks

## 2017-12-07 ENCOUNTER — Other Ambulatory Visit: Payer: Self-pay | Admitting: *Deleted

## 2017-12-07 MED ORDER — NITROGLYCERIN 0.2 MG/HR TD PT24: MEDICATED_PATCH | TRANSDERMAL | 1 refills | Status: DC

## 2017-12-21 ENCOUNTER — Ambulatory Visit: Payer: BC Managed Care – PPO | Admitting: Sports Medicine

## 2017-12-21 ENCOUNTER — Encounter: Payer: Self-pay | Admitting: Sports Medicine

## 2017-12-21 ENCOUNTER — Ambulatory Visit: Payer: Self-pay

## 2017-12-21 VITALS — BP 108/62

## 2017-12-21 DIAGNOSIS — M7672 Peroneal tendinitis, left leg: Secondary | ICD-10-CM | POA: Diagnosis not present

## 2017-12-21 DIAGNOSIS — M25572 Pain in left ankle and joints of left foot: Secondary | ICD-10-CM

## 2017-12-21 NOTE — Assessment & Plan Note (Signed)
Patient is here to follow-up regarding her left-sided peroneal tendinitis.  Symptoms have been refractory to conservative management at this time.  Ultrasound showed neovascularity, edema, and likely splitting of the peroneous brevis tendon. -MRI ordered today -[facility], Dr. Victorino DikeHewitt, to assess if surgical intervention is necessary at this time. -Continue HEP and RICE therapy as tolerated -We will contact patient with MRI results.

## 2017-12-21 NOTE — Progress Notes (Signed)
HPI  CC: Left peroneal brevis tendinitis/tendinosis Patient is here to follow-up regarding her left ankle pain likely related to the previously described peroneous brevis tendinopathy.  Patient states that she had had some mild improvement since "shutting myself down" but states that she had attempted some light activity in hopes to get back to previous activity levels only and have another setback in which she describes it as a "pop".  She continues to have swelling and pain along the lateral aspect of her left distal ankle.  No new bruising ecchymosis or bony deformity.  She is able to walk without a limp.  No weakness, numbness, or paresthesias.  Patient states that she is getting frustrated with the lack of improvement given the amount of time she has been treating this injury.  Medications/Interventions Tried: Relative rest, HEP, OTC NSAIDs.  See HPI and/or previous note for associated ROS.  Objective: BP 108/62  Gen: NAD, well groomed, a/o x3, normal affect.  CV: Well-perfused. Warm.  Resp: Non-labored.  Neuro: Sensation intact throughout. No gross coordination deficits.  Gait: Nonpathologic posture, unremarkable stride without signs of limp or balance issues. Ankle/Foot, Left: TTP noted at the lateral ankle along the track of the peroneal tendons, greatest at the insertion of the peroneous brevis and distally to the lateral malleolus. No visible erythema, ecchymosis, or bony deformity.  Mild swelling appreciated.  Notable pes cavus deformity. No evidence of tibiotalar deviation; Range of motion is full in all directions. Strength is 5/5 in all directions with some discomfort with eversion against resistance. Talar dome nontender; No plantar calcaneal tenderness; No tenderness over cuboid; No pain at base of 5th MT; No tenderness at the distal metatarsals; Able to walk 4 steps.   ULTRASOUND: Ankle, Left Diagnostic limited ultrasound imaging obtained of patient's left ankle.  -Inspection  of the peroneal tendons showed proximal irregularity with small calcification of the peroneous brevis tendon about 3 cm proximal to the tip of the lateral malleolus.  Distally there was some splitting, edema, and fluid accumulation at the peroneous brevis near the peroneal tubercle.  Splitting was significant enough to consider possible presence of a peroneous quartus tendon. -Increased vascular changes noted on color Doppler surrounding the edematous tendon near the peroneal tubercle.  IMPRESSION: findings consistent with splitting, neovascularity, and reactive edema within the peroneous brevis at the level of the peroneal tubercle.  Small tear and calcific change of the peroneous brevis proximal to the lateral malleolus.   Assessment and plan:  Peroneal tendinitis, left Patient is here to follow-up regarding her left-sided peroneal tendinitis.  Symptoms have been refractory to conservative management at this time.  Ultrasound showed neovascularity, edema, and likely splitting of the peroneous brevis tendon. -MRI ordered today -Referral to [facility], Dr. Victorino Dike, to assess if surgical intervention is necessary at this time. -Continue HEP and RICE therapy as tolerated -We will contact patient with MRI results.   Orders Placed This Encounter  Procedures  . [place] COMPLETE JOINT SPACE STRUCTURES LOW LEFT    Standing Status:   Future    Number of Occurrences:   1    Standing Expiration Date:   02/20/2019    Order Specific Question:   Reason for Exam (SYMPTOM  OR DIAGNOSIS REQUIRED)    Answer:   left ankle    Order Specific Question:   Preferred imaging location?    Answer:   Internal  . MR ANKLE LEFT WO CONTRAST    Standing Status:   Future  Standing Expiration Date:   02/21/2019    Order Specific Question:   What is the patient's sedation requirement?    Answer:   No Sedation    Order Specific Question:   Does the patient have a pacemaker or implanted devices?    Answer:   No     Order Specific Question:   Preferred imaging location?    Answer:   [facility] (table limit-550lbs)    Order Specific Question:   Radiology Contrast Protocol - do NOT remove file path    Answer:   [email or web address]    Kathee DeltonIan D McKeag, MD,MS [facility] Fellow 12/21/2017 6:03 PM  I observed and examined the patient with the [facility] and agree with assessment and plan.  Note reviewed and modified by me. She is high level tennis athlete and I suspect this is a peroneus brevis tear rather than just tendinosis.  Await MRI.  Dr. Laverta BaltimoreHewitt's opinion.  Sterling BigKB Collier Monica, MD

## 2017-12-23 ENCOUNTER — Ambulatory Visit
Admission: RE | Admit: 2017-12-23 | Discharge: 2017-12-23 | Disposition: A | Discharge status: Home / Self Care | Payer: BC Managed Care – PPO | Source: Ambulatory Visit | Attending: Sports Medicine | Admitting: Sports Medicine

## 2017-12-23 DIAGNOSIS — M25572 Pain in left ankle and joints of left foot: Secondary | ICD-10-CM

## 2018-04-10 ENCOUNTER — Ambulatory Visit
Admission: RE | Admit: 2018-04-10 | Discharge: 2018-04-10 | Disposition: A | Discharge status: Home / Self Care | Payer: BC Managed Care – PPO | Source: Ambulatory Visit | Attending: Sports Medicine | Admitting: Sports Medicine

## 2018-04-10 ENCOUNTER — Ambulatory Visit (INDEPENDENT_AMBULATORY_CARE_PROVIDER_SITE_OTHER): Payer: BC Managed Care – PPO | Admitting: Sports Medicine

## 2018-04-10 VITALS — BP 102/66 | Ht 64.0 in | Wt 118.0 lb

## 2018-04-10 DIAGNOSIS — M25531 Pain in right wrist: Secondary | ICD-10-CM

## 2018-04-10 MED ORDER — MELOXICAM 15 MG PO TABS: ORAL_TABLET | ORAL | 0 refills | Status: DC

## 2018-04-11 ENCOUNTER — Encounter: Payer: Self-pay | Admitting: Sports Medicine

## 2018-04-11 NOTE — Progress Notes (Signed)
   Subjective:    Patient ID: Marie Hood, female    DOB: 1960-08-20, 57 y.o.   MRN: [id number]  HPI chief complaint: Right wrist pain  Very pleasant 57 year old right-hand-dominant female comes in today complaining of approximately 4 weeks of ulnar-sided right wrist pain.  She enjoys playing tennis and has noticed the majority of her pain when playing.  However, it is gotten to the point now that she is having pain with activities of daily living as well.  She endorses some mild pain that began without any specific injury that became acutely worse while playing tennis 1 day.  She describes as a sharp stabbing discomfort.  She has noticed some swelling.  She denies any radial sided wrist pain.  No trauma.  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  Surgery was performed several years ago by Dr. Onalee Hua and she has done well postoperatively.  Patient has had some treatment by a physical therapy with limited improvement.  She also took a couple weeks off from playing tennis which was only minimally helpful.  Past medical history reviewed Past surgical history reviewed.  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 Medications reviewed Allergies reviewed    Review of Systems    As above Objective:   Physical Exam  Well-developed, well-nourished.  No acute distress.  Awake alert and oriented x3.  Vital signs reviewed  Right wrist: There is a trace amount of soft tissue swelling along the ulnar wrist.  She has decreased wrist extension and flexion when compared to the uninvolved right wrist.  Good ulnar and radial deviation.  Slight pain with resisted ulnar deviation.  Tender to palpation along the area of the TFCC with an equivocal liftoff test.  No tenderness over the anatomic snuffbox.  Negative Tinel's over the  carpal tunnel.  Decreased grip strength secondary to pain.  Good pulses.  X-ray of the right wrist including AP, lateral, and scaphoid views are unremarkable.  She appears to be ulnar neutral.  No early degenerative changes seen  MSK ultrasound of the right wrist was performed.  Limited images were obtained of the ulnar wrist.  ECU tendon was well visualized and appears fairly unremarkable.  Visualized portion of the TFCC does not show any obvious tear but there is definite fluid surrounding it.  Findings could be consistent with an occult TFCC tear of the right wrist.      Assessment & Plan:   Right wrist pain possibly secondary to TFCC tear  I had a long talk with the patient regarding work-up and treatment.  She has had a TFCC repair in the left wrist previously and has done well postoperatively.  Current symptoms are similar in nature to what she experienced at that time.  I recommended that we try a body helix compression sleeve and that she avoid tennis for the next 4 weeks.  I am also going to put her on a short course of meloxicam with instructions to take 15 mg daily for 5 days then as needed.  She is in the process of moving to [place], [place] so have asked that she call me in 4 weeks with an update.  I did discuss the possibility of MRI if symptoms persist despite relative rest.  Alternatively, we could refer her to Dr. Amanda Pea.

## 2018-04-24 ENCOUNTER — Ambulatory Visit: Payer: BC Managed Care – PPO | Admitting: Sports Medicine

## 2018-04-24 VITALS — BP 102/68 | Ht 64.0 in | Wt 118.0 lb

## 2018-04-24 DIAGNOSIS — M5030 Other cervical disc degeneration, unspecified cervical region: Secondary | ICD-10-CM | POA: Diagnosis not present

## 2018-04-24 DIAGNOSIS — M25531 Pain in right wrist: Secondary | ICD-10-CM

## 2018-04-25 NOTE — Progress Notes (Signed)
   Subjective:    Patient ID: Marie Hood, female    DOB: [birth year], 57 y.o.   MRN: [id number]  HPI   Patient comes in today with persistent right wrist pain.  She continues to localize the pain to the ulnar aspect of the wrist.  She did not tolerate the meloxicam.  She has not been playing tennis.  She has a history of a previous TFCC tear in the left wrist which required surgical repair. She is also complaining of neck pain.  This is chronic for her.  X-rays of her cervical spine done in February of this year showed moderate disc space narrowing at C5-C6 and C6-C7 as well as diffuse facet arthropathy.  She localizes her pain to the sides of her neck.  It is worse with looking up.  No radiating pain into her arms.  No numbness or tingling.  Massage does help temporarily.  She has been doing some limited physical therapy as well.    Review of Systems As above    Objective:   Physical Exam  Well-developed, well-nourished.  No acute distress.  Awake alert and oriented x3.  Vital signs reviewed  Right wrist: Patient still demonstrates decreased wrist extension and flexion when compared to the uninvolved right side.  Tender to palpation over the area of the TFCC with equivocal liftoff test.  Good pulses.  Negative Tinel's over the carpal tunnel and over Guyon's canal.  Cervical spine: Good cervical range of motion.  There is reproducible pain with flexion and extension.  No midline tenderness to palpation.  She is tender to palpation along the facets both on the right and left side of her neck.  No palpable crepitus.  Good strength.  X-rays of her cervical spine are as above Recent x-rays of the right wrist were unremarkable      Assessment & Plan:   Chronic neck pain secondary to cervical degenerative disc disease and facet arthropathy Right wrist pain worrisome for TFCC tear  Patient will add isometric neck exercises to her current physical therapy.  She understands that her neck pain  will likely be chronic but hopefully will be tolerable.  If not, we could consider merits of further diagnostic imaging in anticipation of possible facet injections. For her right wrist, I am concerned that she may have a TFCC tear.  She has a history of failed conservative treatment on the left which required surgery by Dr. Amanda Pea.  If surgery is indicated, patient would like to proceed with this before the end of the year.  Therefore, we will order an MRI of her wrist specifically to rule out a TFCC tear that may need surgical intervention.  I will call the patient with those results when available.  Of note, patient had a bad outcome with a previous cortisone injection for lateral epicondylitis in the right elbow so we have elected not to do a cortisone injection into the TFCC today.

## 2018-04-29 ENCOUNTER — Ambulatory Visit
Admission: RE | Admit: 2018-04-29 | Discharge: 2018-04-29 | Disposition: A | Discharge status: Home / Self Care | Payer: BC Managed Care – PPO | Source: Ambulatory Visit | Attending: Sports Medicine | Admitting: Sports Medicine

## 2018-04-29 DIAGNOSIS — M25531 Pain in right wrist: Secondary | ICD-10-CM

## 2018-05-01 ENCOUNTER — Other Ambulatory Visit: Payer: Self-pay

## 2018-05-01 ENCOUNTER — Telehealth: Payer: Self-pay | Admitting: Sports Medicine

## 2018-05-01 DIAGNOSIS — M25531 Pain in right wrist: Secondary | ICD-10-CM

## 2018-05-01 NOTE — Telephone Encounter (Signed)
I spoke with the patient on the phone today after reviewing the MRI of her right wrist.  She does have a small focal tear of the TFCC as well as a small partial tear of the extensor carpi ulnaris tendon.  Given her previous history of left wrist surgery with Dr. Onalee Hua I recommended consultation with him for treatment advice regarding her right wrist.  Patient is in agreement with that plan.  I will defer further work-up and treatment to his discretion and the patient will follow-up with me as needed.

## 2018-05-09 ENCOUNTER — Other Ambulatory Visit: Payer: Self-pay

## 2018-05-09 MED ORDER — MELOXICAM 15 MG PO TABS: ORAL_TABLET | ORAL | 0 refills | Status: DC

## 2019-08-13 ENCOUNTER — Other Ambulatory Visit: Payer: Self-pay

## 2019-08-13 ENCOUNTER — Ambulatory Visit: Payer: BC Managed Care – PPO | Admitting: Sports Medicine

## 2019-08-13 DIAGNOSIS — M160 Bilateral primary osteoarthritis of hip: Secondary | ICD-10-CM | POA: Diagnosis not present

## 2019-08-13 DIAGNOSIS — Q667 Congenital pes cavus, unspecified foot: Secondary | ICD-10-CM | POA: Diagnosis not present

## 2019-08-13 NOTE — Assessment & Plan Note (Signed)
She needs to continue with PT because she has managed to stabilize the hip arthritis  Use her orthotics for sports activity

## 2019-08-13 NOTE — Assessment & Plan Note (Signed)
Because of ongoing OA issues she really needs to exercise using her orthotics  After completion of these today she had good control of any pronation  She felt good support  She will use these until she loses support and then we will repeat them

## 2019-08-13 NOTE — Progress Notes (Signed)
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  Without orthotic support she gets significant back pain and some pain in her hips and other joints  She works regularly with physical therapy but uses very little medication  Review of systems No sciatica No lower extremity weakness  Physical exam Physically fit female in no acute distress BP 102/60   Ht 5\' 4"  (1.626 m)   Wt 109 lb (49.4 kg)   BMI 18.71 kg/m   Hip range of motion on the right is significantly limited in internal rotation to about 10 degrees and external rotation is only 30 degrees Hip range of motion on the left is about 20 degrees internal rotation and 35 degrees external rotation  Excellent strength on testing of her lower extremities  Foot position reveals a moderately high arch with a cavus shape Flattening of the transverse arch Feet are different size Slight difference in leg length  Patient was fitted for a : standard, cushioned, semi-rigid orthotic. The orthotic was heated and afterward the patient stood on the orthotic blank positioned on the orthotic stand. The patient was positioned in subtalar neutral position and 10 degrees of ankle dorsiflexion in a weight bearing stance. After completion of molding, a stable base was applied to the orthotic blank. The blank was ground to a stable position for weight bearing. Size: 10 F thin EVA orthotic Base:  Black dense EVA heel wedge Posting: none Additional orthotic padding: none  I spent 28 minutes with this patient. Over 50% of visit was spend in counseling and coordination of care for problems with osteoarthritis.

## 2019-08-22 IMAGING — MR MR WRIST*R* W/O CM
6 series · 40 of 40 positions shown · non-contrast
Comparison: Right wrist x-rays dated April 10, 2018.

CLINICAL DATA: Persistent right wrist pain since tennis injury 2
months ago. Evaluate for TFCC tear.

EXAM:
MR OF THE RIGHT WRIST WITHOUT CONTRAST
TECHNIQUE: Multiplanar, multisequence MR imaging of the right wrist was
performed. No intravenous contrast was administered.

[Series 3: T2 fat-sat · axial · right · 3.0mm · 0.31mm/px · z∈[-72,+34]mm · 7 of 32 slices shown (1 of 2)]
[im 1/32]
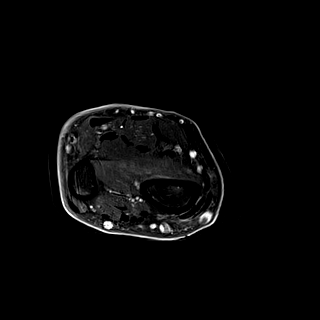
[im 6/32]
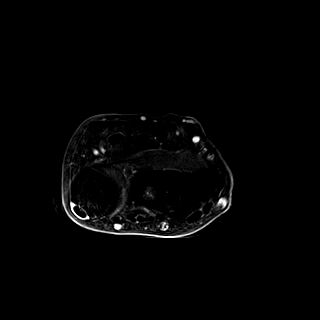
[im 11/32]
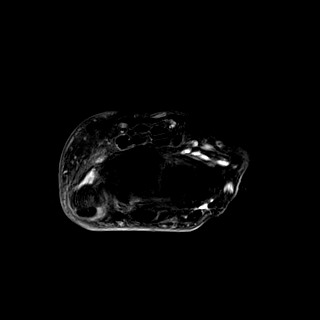
[im 16/32]
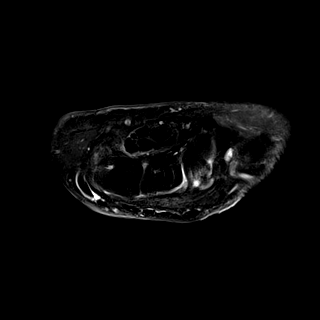
[im 21/32]
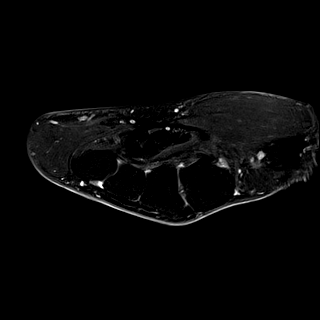
[im 26/32]
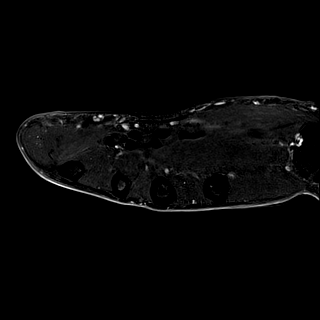
[im 32/32]
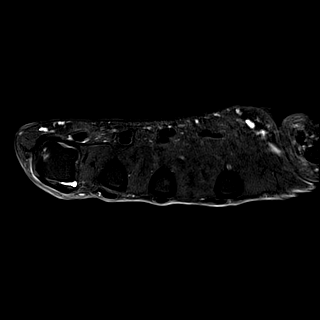

[Series 4: T1 · axial · right · 3.0mm · 0.31mm/px · z∈[-72,+34]mm · 8 of 32 slices shown (1 of 2)]
[im 1/32]
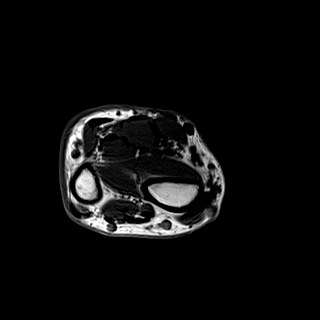
[im 5/32]
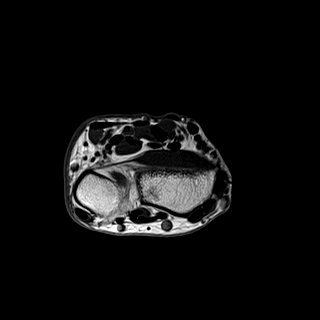
[im 9/32]
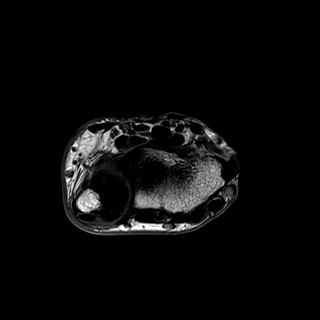
[im 14/32]
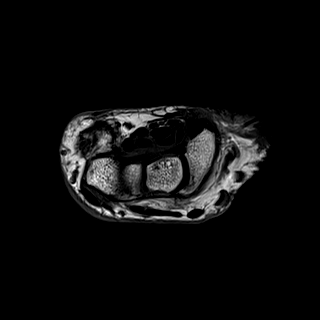
[im 18/32]
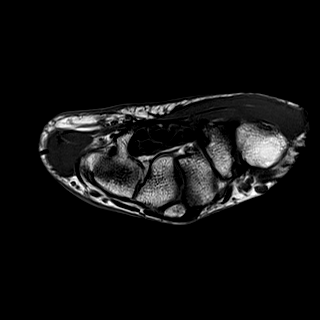
[im 23/32]
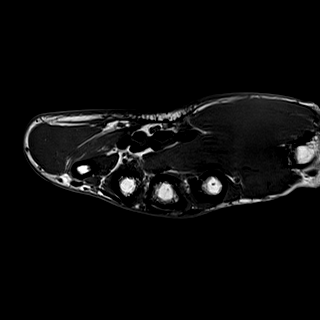
[im 27/32]
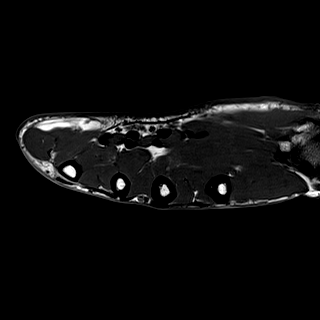
[im 32/32]
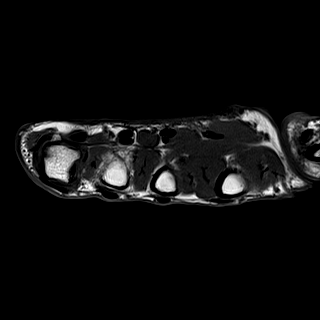

[Series 5: T1 · coronal · right · 3.0mm · 0.23mm/px · 6 of 24 slices shown (2 of 2)]
[im 1/24]
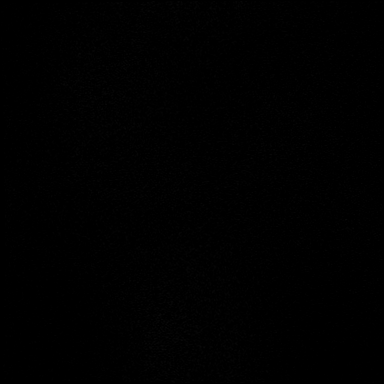
[im 5/24]
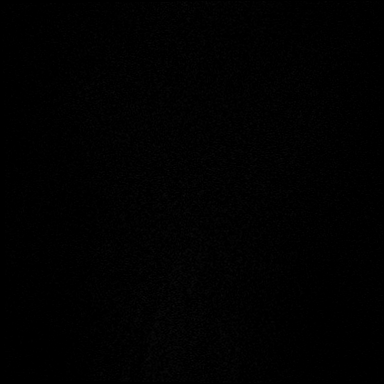
[im 10/24]
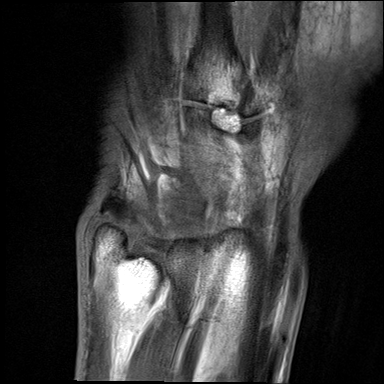
[im 14/24]
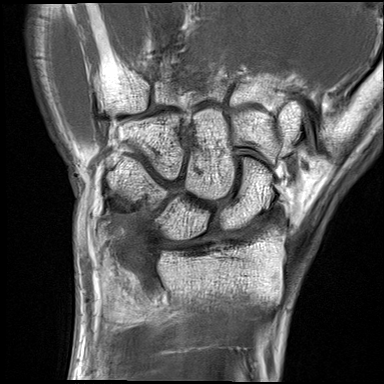
[im 19/24]
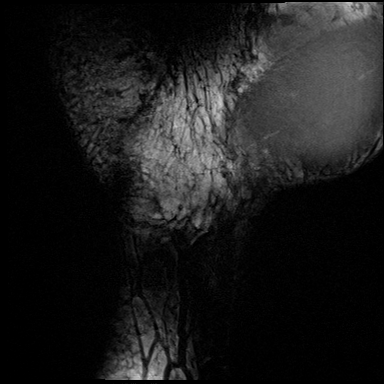
[im 24/24]
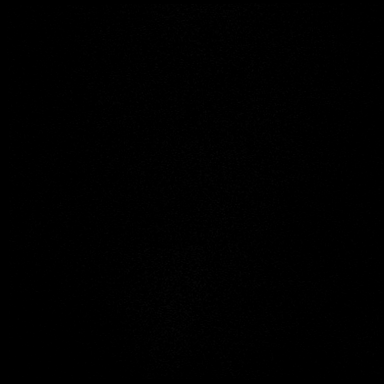

[Series 6: T2 fat-sat · coronal · right · 3.0mm · 0.31mm/px · 6 of 24 slices shown (2 of 2)]
[im 1/24]
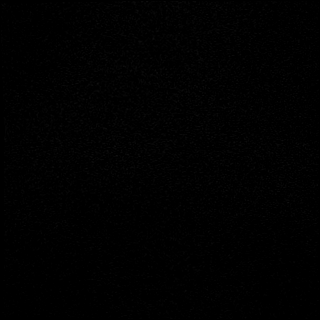
[im 5/24]
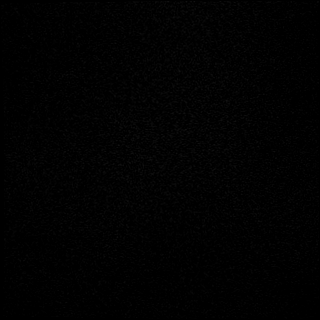
[im 10/24]
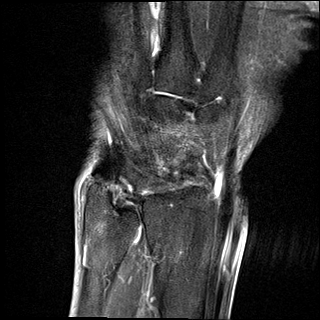
[im 14/24]
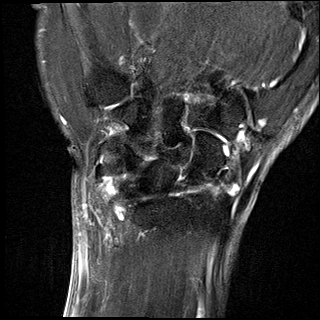
[im 19/24]
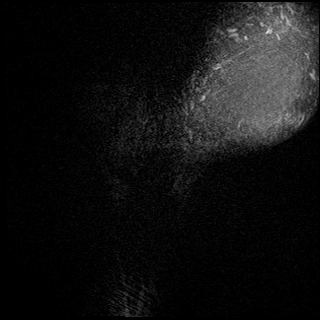
[im 24/24]
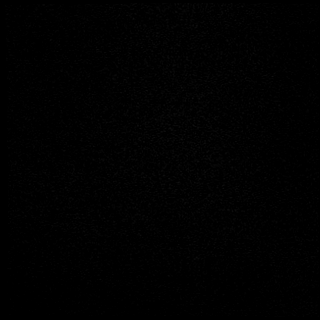

[Series 7: PD fat-sat · coronal · right · 3.0mm · 0.31mm/px · 6 of 24 slices shown (1 of 2)]
[im 1/24]
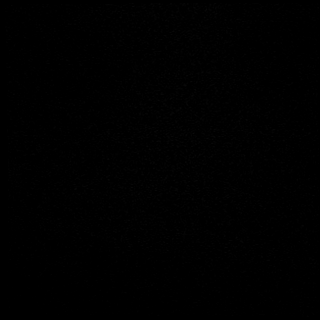
[im 5/24]
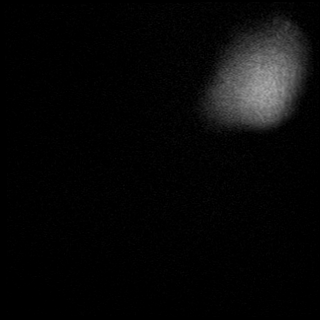
[im 10/24]
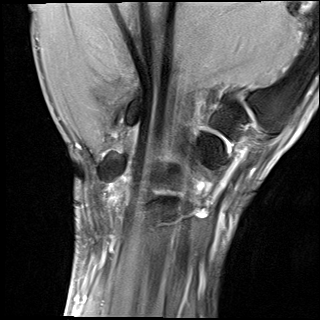
[im 14/24]
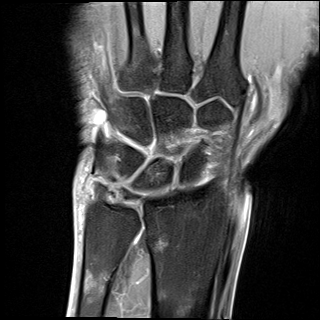
[im 19/24]
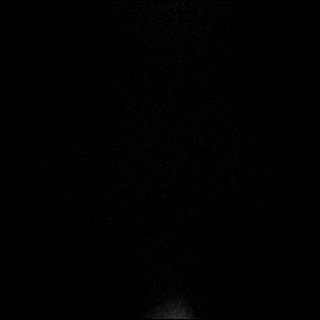
[im 24/24]
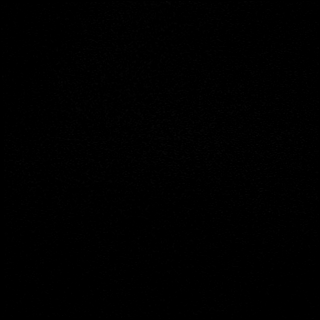

[Series 8: PD fat-sat · sagittal · right · 3.0mm · 0.31mm/px · 7 of 30 slices shown (2 of 2)]
[im 1/30]
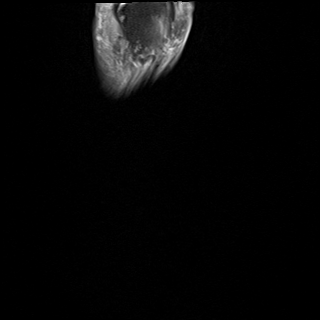
[im 5/30]
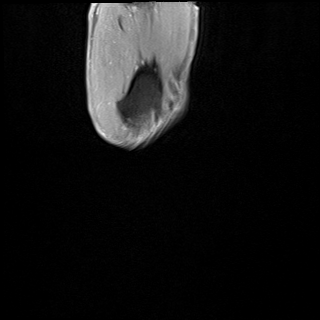
[im 10/30]
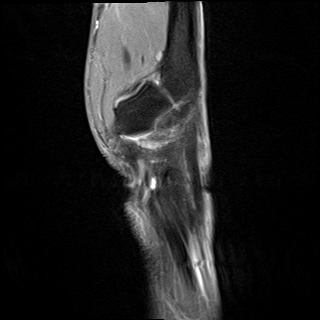
[im 15/30]
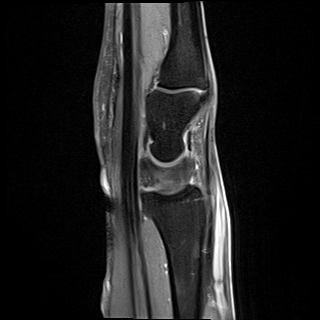
[im 20/30]
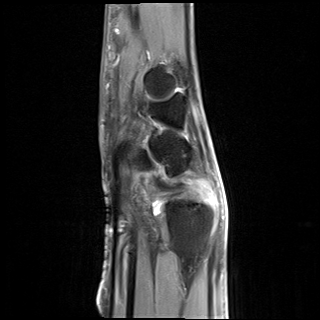
[im 25/30]
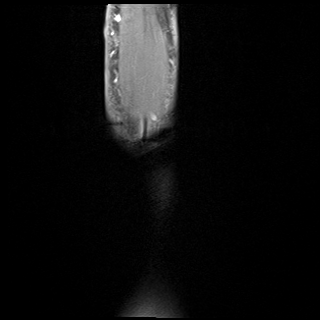
[im 30/30]
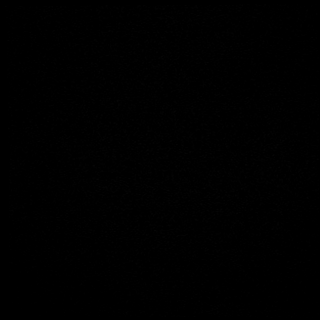

[40 of 40 positions shown; findings below may reference images not displayed]

FINDINGS: Ligaments: Intact scapholunate and lunotriquetral ligaments.

Triangular fibrocartilage: There is a focal small partial-thickness
undersurface tear of the articular disc.

Tendons: Tenosynovitis, mild tendinosis, and small partial tear of
the extensor carpi ulnaris tendon. Remaining flexor and extensor
tendons are unremarkable.

Carpal tunnel/median nerve: Normal carpal tunnel. Bifid median nerve
with persistent median artery.

Guyon's canal: Normal.

Joint/cartilage: No joint effusion. No chondral defect.

Bones/carpal alignment: Mild marrow edema in the dorsal aspect of
the distal ulnar diaphysis. No acute fracture or dislocation. No
focal bone lesion.

Other: No fluid collection or hematoma.
IMPRESSION: 1. Mild extensor carpi ulnaris tenosynovitis with small partial
tendon tear at the level of the ulnar groove.
2. Focal small partial-thickness undersurface tear of the TFCC
articular disc.
3. Mild marrow edema in the dorsal aspect of the distal ulna
diaphysis may be reactive to overlying ECU tendon pathology.
Alternatively, this may reflect a mild contusion if there is a
history of direct trauma.
4. Incidental note is made of a bifid median nerve with a persistent
median artery.

## 2020-05-07 ENCOUNTER — Other Ambulatory Visit: Payer: Self-pay

## 2020-05-07 ENCOUNTER — Ambulatory Visit: Payer: BC Managed Care – PPO | Admitting: Sports Medicine

## 2020-05-07 VITALS — BP 98/61 | Ht 64.0 in | Wt 110.0 lb

## 2020-05-07 DIAGNOSIS — M542 Cervicalgia: Secondary | ICD-10-CM | POA: Diagnosis not present

## 2020-05-07 DIAGNOSIS — M5030 Other cervical disc degeneration, unspecified cervical region: Secondary | ICD-10-CM

## 2020-05-07 DIAGNOSIS — Q667 Congenital pes cavus, unspecified foot: Secondary | ICD-10-CM

## 2020-05-07 DIAGNOSIS — Q6672 Congenital pes cavus, left foot: Secondary | ICD-10-CM

## 2020-05-07 DIAGNOSIS — M160 Bilateral primary osteoarthritis of hip: Secondary | ICD-10-CM

## 2020-05-07 DIAGNOSIS — Q6671 Congenital pes cavus, right foot: Secondary | ICD-10-CM | POA: Diagnosis not present

## 2020-05-07 NOTE — Progress Notes (Signed)
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 Patient states that her neck pain has been stable for several years, but she is curious to know if she needs to X-rays to evaluate for progression of her known arthritis disease. Denies any radicular symptoms or weakness in either arm. She has noticed some pain in her medial and lateral epicondyles of her right elbow, but attributes this to trying to learn a new tennis swing.  She also has a history of 'stiff hips' bilaterally, and wanted to know if she she reimage these as well. She will occasionally feel pain in her hips (deep in the groin) bilaterally (L>R) after a hard game of tennis, but states that it doesn't significantly hold her back from activities.  Finally, patient has a history of pes cavus, and states that, without orthotics, she will get foot pain with activity. She would like some new orthotics today, as her current pair has gotten old.               ROS:   All other systems were reviewed and are negative.  Objective: Vital Signs: BP 98/61   Ht 5\' 4"  (1.626 m)   Wt 110 lb (49.9 kg)   BMI 18.88 kg/m   Physical Exam:  General:  Alert and oriented, in no acute distress. Pulm:  Breathing unlabored. Psy:  Normal mood, congruent affect. Skin:  No obvious bruises or rashes.   MSK:  Normal gait.  Normal spinal curvature in lumbar and thoracic spine. Somewhat reduced cervical lordosis.  No varus or valgus deformity of the knee, appropriate Q angle of the hip.   Full range of motion of hip with flexion/extension, with no pain.  Reduced internal and external rotation, though this is  symmetric- to approximately 20* IR, 40* ER. Strength: 5 out of 5 strength with hip flexion and extension, as well as knee flexion and extension.  5 out of 5 strength with hip abduction and adduction.   Foot Exam with moderate pes Cavus bilaterally, with flattening of transverse arch upon standing.  Right foot larger than left.   Neck with full ROM. Tenderness to palpation along cervical paraspinal pillars on the right at approximately level of C5-T1.  Negative Sperling.   Imaging: No results found.  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   Neck Pain:  Symptoms have remained largely stable with no radiculopathy or upper extremity weakness. Doubt need for new imaging at this time. Would consider in future if symptoms worsen.   Hip Pain:  Similar to her neck symptoms, patient has known degenerative changes, through these have not acutely worsened. Remains able to function with her usual athletic endeavors without significant impairment.  No need for new imaging at this time. Continue current exercise routine for hip ROM.   Pes Cavus:  Fit for new Orthotics today, which patient voiced were comfortable prior to leaving clinic.  -Return as needed for adjustments  Patient expresses understanding with  plan, and has no further questions or concerns today.      I observed and examined the patient with them SM resident and agree with assessment and plan.  Note reviewed and modified by me. Sterling Big, MD

## 2020-05-08 NOTE — Assessment & Plan Note (Signed)
Patient was fitted for a standard, cushioned, semi-rigid orthotic.  The orthotic was heated and the patient stood on the orthotic blank positioned on the orthotic stand. The patient was positioned in subtalar neutral position and 10 degrees of ankle dorsiflexion in a weight bearing stance. After molding, a stable Fast-Tech EVA base was applied to the orthotic blank.   The blank was ground to a stable position for weight bearing. Size: 9 dress thin blank and black medium fast tech support blank Base: on dress orthotic med. Density EVA heel pad/ on sport orthotic we used 3/4 length meidum tapred tan EVA posting: none additional orthotic padding: none  Time 45 mins  Gait was neutral post completion Good comfort

## 2020-05-08 NOTE — Assessment & Plan Note (Signed)
This is chronic I advised patient that I saw limited benefit in new XR at this time Isometrics Posture Prn medication

## 2021-03-18 ENCOUNTER — Ambulatory Visit (INDEPENDENT_AMBULATORY_CARE_PROVIDER_SITE_OTHER): Payer: BC Managed Care – PPO | Admitting: Sports Medicine

## 2021-03-18 DIAGNOSIS — M5136 Other intervertebral disc degeneration, lumbar region: Secondary | ICD-10-CM | POA: Diagnosis not present

## 2021-03-18 DIAGNOSIS — M5030 Other cervical disc degeneration, unspecified cervical region: Secondary | ICD-10-CM | POA: Diagnosis not present

## 2021-03-18 MED ORDER — AMITRIPTYLINE HCL 25 MG PO TABS: 25.0000 mg | ORAL_TABLET | Freq: Every day | ORAL | 1 refills | Status: DC

## 2021-03-18 NOTE — Assessment & Plan Note (Signed)
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

## 2021-03-18 NOTE — Progress Notes (Signed)
   Subjective:    Patient ID: Marie Hood, female    DOB: 1961/01/21, 60 y.o.   MRN: [id number]  HPI Patient is presenting for the evaluation of neck pain, hand numbness and lower back pain.  Patient has had chronic issues with neck pain for years.  She expresses concern for cervical radiculopathy.  Previous cervical spine x-ray in 2019 revealed degenerative disc disease at C5-6 and C6-7.  Over the past 2 to 3 months, she has noticed numbness and tingling in digits 1 through 3 of her right hand which originates from the elbow.  She states she did have two chiropractic neck manipulation treatments prior to symptoms.  She felt the numbness come on after second RX so she did not return. She denies any radiation of pain from the neck to her hand.  She thinks she may be a little bit weaker in her right hand with grip strength but she is unsure.   Patient also has chronic issues with back pain which she feels has been getting worse.  She has radiation of pain down her posterior right leg intermittently.  Pain is worse after activity and for example, will notice pain after driving 20 minutes after playing tennis when trying to get up from the car.  Denies any numbness, tingling, weakness, bowel/bladder incontinence.   Review of Systems No weakness in legs No cough or sneeze pain    Objective:   Physical Exam Alert, NAD  Cervical spine: No obvious deformity.  Decreased ROM with lateral flexion bilaterally.  Lateral flexion to the right with pain.  She has good strength bilaterally with shoulder abduction, elbow flexion, elbow extension, and wrist extension.  Subjective decreased sensation at the right first, second, and third digits of hand compared to left.  Symmetric biceps and brachioradialis reflexes.  Difficult to elicit triceps reflex.  Spurling's negative.  Tinel's sign negative at wrist and elbow.  Lumbar spine: No obvious deformity.  No tenderness to palpation along midline spine and paraspinal  muscles.  Full strength with hip flexion, knee flexion, knee extension, plantarflexion, and dorsiflexion.  Achilles reflex symmetric.  Difficult to elicit patellar reflexes.  Straight leg raise is positive on the right.       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  Reassuringly, she has preserved strength.  Do not suspect she has cervical disc herniation or true cervical radiculopathy at this time.  Suspect this can take several months to heal.  We will try low-dose amitriptyline which should also help with her sciatica.  Recommended isometric neck exercises.  Plan for follow-up in 1 month.  Littie Deeds, MD  PGY-2  I observed and examined the patient with the resident and agree with assessment and plan.  Note reviewed and modified by me. Sterling Big, MD

## 2021-03-18 NOTE — Assessment & Plan Note (Signed)
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

## 2021-03-18 NOTE — Patient Instructions (Signed)
Do isometric neck exercises 5x10, 2-3 times daily. Follow-up in 1 month.

## 2021-04-09 ENCOUNTER — Other Ambulatory Visit: Payer: Self-pay | Admitting: Family Medicine

## 2021-04-15 ENCOUNTER — Ambulatory Visit: Payer: BC Managed Care – PPO | Admitting: Sports Medicine

## 2021-05-10 ENCOUNTER — Other Ambulatory Visit: Payer: Self-pay | Admitting: Family Medicine

## 2021-05-13 ENCOUNTER — Other Ambulatory Visit: Payer: Self-pay

## 2021-05-13 MED ORDER — MELOXICAM 15 MG PO TABS: ORAL_TABLET | ORAL | 0 refills | Status: DC

## 2021-05-13 NOTE — Progress Notes (Signed)
Pt called requesting refill. She is scheduled for a virtual f/u appt on 06/01/21.  Refill sent to pharmacy.

## 2021-06-01 ENCOUNTER — Telehealth (INDEPENDENT_AMBULATORY_CARE_PROVIDER_SITE_OTHER): Payer: BC Managed Care – PPO | Admitting: Sports Medicine

## 2021-06-01 DIAGNOSIS — M5136 Other intervertebral disc degeneration, lumbar region: Secondary | ICD-10-CM

## 2021-06-01 MED ORDER — MELOXICAM 15 MG PO TABS: ORAL_TABLET | ORAL | 3 refills | Status: AC

## 2021-06-01 MED ORDER — AMITRIPTYLINE HCL 25 MG PO TABS: 25.0000 mg | ORAL_TABLET | Freq: Every day | ORAL | 3 refills | Status: DC

## 2021-06-01 NOTE — Progress Notes (Signed)
Patient called for a refill of medications that help her with Cervical radicular symptoms Low back pain issues General joint ache  Video did not work so we discussed her refills over the phone. I will not charge her for the telephone discussion.  We decided to renew her Meloxicam 15 to use for joint pain Amitriptyline 25 to use at night if she gets more radicular symptoms  Recheck by me as needed

## 2021-06-01 NOTE — Assessment & Plan Note (Signed)
Renewed Meloxicam today

## 2021-06-25 ENCOUNTER — Other Ambulatory Visit: Payer: Self-pay | Admitting: Sports Medicine

## 2021-08-02 ENCOUNTER — Other Ambulatory Visit: Payer: Self-pay | Admitting: Sports Medicine

## 2022-02-01 ENCOUNTER — Ambulatory Visit: Payer: BC Managed Care – PPO | Admitting: Sports Medicine

## 2022-02-01 VITALS — BP 101/57 | Ht 64.0 in | Wt 112.0 lb

## 2022-02-01 DIAGNOSIS — M7742 Metatarsalgia, left foot: Secondary | ICD-10-CM | POA: Diagnosis not present

## 2022-02-01 DIAGNOSIS — M7741 Metatarsalgia, right foot: Secondary | ICD-10-CM

## 2022-02-01 NOTE — Assessment & Plan Note (Signed)
Symptoms consistent with metatarsalgia.  Patient had plethora of insoles with her today.  Metatarsal pads were placed on greater than 4 of her insoles.  She ambulated with new insoles and reported improvement in her pain already.  Patient to continue with insoles especially with activity.  Return to clinic in 1 month if symptoms do not improve as her insoles may require modifications.  Patient continues to look for comfortable shoes that can accommodate or feet size discrepancy.  I recommend softer toe area as opposed to rigid leather.  Patient verbalized understanding.

## 2022-02-01 NOTE — Progress Notes (Signed)
Established Patient Office Visit  Subjective   Patient ID: Marie Hood, female    DOB: March 13, 1961  Age: 61 y.o. MRN: [id number]  Bilateral foot pain right greater than left  Ms. Marie Hood presents today with chief complaint of bilateral foot pain, right greater than left that has been present for approximately approximately 5 to 6 weeks.  During a tennis match she experienced some pain and numbness in her right foot.  After she completed the match she noticed some swelling on the anterior surface and burning in her right foot.  She subsequently rested for 1 week and had improvement in her symptoms.  She returned to tennis but was still having pain in her right foot.  She again rested from tennis for 1 week however she did participate in some hiking in [place].  To provide some relief she added her on metatarsal pad.  She continues to have this discomfort in her right foot, and has recently been experiencing some discomfort in her left foot as well.  She denies any trauma to the area, bruising, past medical history of diabetes, or weakness.    Objective:     BP (!) 101/57   Ht 5\' 4"  (1.626 m)   Wt 112 lb (50.8 kg)   BMI 19.22 kg/m   Physical Exam Vitals reviewed.  Constitutional:      General: She is not in acute distress.    Appearance: Normal appearance. She is normal weight. She is not ill-appearing, toxic-appearing or diaphoretic.  HENT:     Head: Normocephalic.  Pulmonary:     Effort: Pulmonary effort is normal.  Neurological:     Mental Status: She is alert.   Bilateral feet: No obvious deformity.  She does have an asymmetry, right foot larger than left.  No swelling, ecchymosis or erythema visualized today.  Pes cavus greater on the left than right. Right foot: On the plantar aspect noticeable callus under her second metatarsal.  Tenderness to palpation along the anterior aspect of the second and third metatarsals.  No palpable nodules.  Slight hammering of toes 3 and 4.  Moderate  loss of transverse arch.  Full range of motion flexion, extension, inversion and eversion.  Strength 5/5 flexion, extension, eversion and inversion.  Negative forefoot squeeze test.  Left foot: Noticeable callus under her first metatarsal.  No tenderness to palpation at the base of the metatarsals, negative squeeze test, full range of motion flexion, extension, inversion and eversion.  Strength 5/5 flexion, extension, eversion and inversion. Patient is able to stand on her tippy toes and heels with minimal to no reproduction of her pain. Normal gait   Assessment & Plan:   Problem List Items Addressed This Visit       Other   Metatarsalgia of both feet - Primary    Symptoms consistent with metatarsalgia.  Patient had plethora of insoles with her today.  Metatarsal pads were placed on greater than 4 of her insoles.  She ambulated with new insoles and reported improvement in her pain already.  Patient to continue with insoles especially with activity.  Return to clinic in 1 month if symptoms do not improve as her insoles may require modifications.  Patient continues to look for comfortable shoes that can accommodate or feet size discrepancy.  I recommend softer toe area as opposed to rigid leather.  Patient verbalized understanding.       Return if symptoms worsen or fail to improve.    , DO  I observed and examined the patient with the [facility] resident and agree with assessment and plan.  Note reviewed and modified by me. We placed MT pads and tested her gait after adjusting the position of the pads.  Use these for ongoing support. Sterling Big, MD

## 2022-12-26 HISTORY — PX: ANTERIOR FUSION CERVICAL SPINE: SUR626

## 2023-04-25 ENCOUNTER — Ambulatory Visit (INDEPENDENT_AMBULATORY_CARE_PROVIDER_SITE_OTHER): Payer: BC Managed Care – PPO | Admitting: Family Medicine

## 2023-04-25 VITALS — BP 102/63 | Ht 64.0 in | Wt 118.0 lb

## 2023-04-25 DIAGNOSIS — M51362 Other intervertebral disc degeneration, lumbar region with discogenic back pain and lower extremity pain: Secondary | ICD-10-CM

## 2023-04-25 DIAGNOSIS — M4722 Other spondylosis with radiculopathy, cervical region: Secondary | ICD-10-CM

## 2023-04-25 NOTE — Progress Notes (Unsigned)
DATE OF VISIT: 04/25/2023        Maliaka Slevin DOB: [birth year] MRN: [id number]  CC:  neck pain, low back pain  History- Marie Hood is a 62 y.o. female for evaluation and treatment of "spine issues". 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  RE: Neck She has been experiencing significant pain in her head and neck.   Had seen neurosurgeon in [place] - Dr Lorenso Courier, starting 08/2022 She had undergone several diagnostic injections in her neck and nerve blocks, but continued to have ongoing issues She later had a consultation with another neurosurgeon - Dr Harlow Asa at [facility] -Ultimately underwent anterior cervical fusion C5-C7 in July 2024 Surgery seem to help some of her general neck pain, as well as radicular symptoms into her hand, but has not helped with her severe headaches and occipital pressure. She continues to have ongoing headaches that can last up to 12+ hours, with associated occipital pressure -Has discussed with her surgeon who thinks she may have a C2 issue She is not interested in any further surgeries She has tried gabapentin and similar medications in the past, but has not liked the way they make her feel.  So she is resistant to try these. She has not tried any occipital or suboccipital nerve blocks.  RE: Low back She has also been having issues with her low back. She reports this is a chronic issue, but has been worsening Has midline pain if she is standing for long periods, that she thinks is related to her history of "kissing spine" - She has tried to discuss this with the surgeons, but they have dismissed her questions about her kissing spine, and they do not  think that this is contributing She has been told she has an issue at L5-S1 and is tentatively scheduled for an anterior lumbar fusion at L5-S1 on 05/08/2023 with Dr. Laurie Panda.  Her insurance company denied her surgery initially, but they are in the process of completing a peer to peer for the procedure. She is limited to short walks due to her symptoms, as she feels as though her legs are going numb. 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  So she would not like to resume these medications She is unsure if surgery is the right next step, and would like to discuss further treatment options  She has had recent imaging including MRI and CT scan.  She did bring discs of these scans with her, but we were unable to load/review the scans due to a DICOM incompatibility issue.  She did not bring the reports with her, but she will plan to send these to [place] for review   Past Medical History History reviewed. No pertinent past medical history.  Past Surgical History  Past Surgical History:  Procedure Laterality Date   ANTERIOR FUSION CERVICAL SPINE  12/2022   C5-C7 with Dr Harlow Asa at [facility]   LUMBAR SPINE SURGERY     possible laminectomy (patient unsure - has midline incision)    Medications Current Outpatient Medications  Medication Sig Dispense Refill   Fish Oil-Cholecalciferol (FISH OIL + D3) 1000-1000 MG-UNIT CAPS Take with food     meloxicam (MOBIC) 15 MG tablet Take 1 tablet daily with food as needed. 30 tablet 3   SUMAtriptan (IMITREX) 25 MG tablet 25 mg as  needed.      thyroid (ARMOUR) 65 MG tablet Take by mouth.     No current facility-administered medications for this visit.    Allergies is allergic to cetirizine.  Family History - reviewed per EMR and intake form  Social History   has no history on file for alcohol use.  reports that she has never smoked. She has never used smokeless tobacco.  has no history on file for drug use.   EXAM: Vitals: BP 102/63   Ht 5\' 4"  (1.626 m)   Wt 118 lb (53.5 kg)   BMI 20.25 kg/m  General: AOx3, NAD, pleasant SKIN: no rashes or lesions, skin clean, dry, intact MSK: CSPINE: Decreased range of motion in all planes without significant pain.   No midline or paraspinal tenderness.  Negative Spurling's bilaterally.  Normal grip strength, normal intrinsic/extrinsic hand strength LSPINE: No gross deformity.  Well-healed midline surgical scar from remote history years ago.  No midline midline or paraspinal tenderness.  Decreased flexion and extension with some pain.  Able to toe walk & heel walk.  lower extremity strength 5/5 bilaterally.  Negative SLR bilaterally.  Negative FABER bilaterally.   Gait: Walking without a limp  NEURO: sensation intact to light touch, DTR + 2/4 bicep, tricep, brachioradialis, Achilles, patella bilaterally VASC: pulses 2+ and symmetric radial artery and DP/PT bilaterally, no edema  IMAGING: XRAYS:  L-spine XR 08/15/2017 Images personally reviewed and interpreted by me today and reviewed with patient showing -Degenerative disc disease L5-S1 and no other significant abnormalities  C-spine XR 08/15/2017 Images personally reviewed and interpreted by me today and reviewed with patient showing -Finding of normal cervical lordosis -High-level degenerative disease most significant C5-C6 and C6-C7 -No other acute abnormalities  CT Lspine 04/07/23 She brought along disc of the scans, but we were unable to load/review the scans due to a DICOM incompatibility issue Review of  CareEverywhere had report showing: - LEVELS: L1-2: No spinal canal or neural foraminal stenosis. L2-3: No spinal canal or neural foraminal stenosis. L3-4: No spinal canal or neural foraminal stenosis. L4-5: No spinal canal or neural foraminal stenosis. L5-S1: Mild diffuse disc bulge with disc calcifications  and mild bilateral facet hypertrophy results in no spinal canal stenosis and moderate to severe right and moderate left neuroforaminal stenosis   IMPRESSION: 1. 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   MRI Cervical Spine, Thoracic Spine, Lumbar Spine 07/13/2022  She brought along disc of the scans, but we were unable to load/review the scans due to a DICOM incompatibility issue Review of CareEverywhere had report showing: IMPRESSION:  MRI of cervical spine: No acute fractures. Degenerative changes resulting in moderate to severe bilateral intervertebral foraminal narrowing and spinal canal stenosis at C5-C6 and C6-C7.   MRI of the thoracic spine: No acute fractures. Mild degenerative changes.   MRI of the lumbar spine: No acute fractures. Mild degenerative  changes. Possible 5 mm vertebral hemangioma at L1.   Assessment & Plan Cervical spondylosis with radiculopathy Chronic neck pain with cervical spondylosis and radiculopathy status post C5-C7 lumbar fusion July 2024, radicular symptoms have improved, but still with ongoing neck pain and significant headaches.  Plan: -Prior visit notes reviewed in detail and CareEverywhere -Reviewed prior imaging studies from 2019, as well as reports from recent MRI and CT scans, but images were not available for review -Discussed medication options including gabapentin, amitriptyline, or similar medications.  Patient has not liked the way she felt with these in the past, therefore is not interested in proceeding with these -Discussed given her symptoms, especially with headaches, she  could potentially be a candidate for suboccipital/occipital nerve block.  She may want to discuss this further with her specialist.  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  Limited improvement with prior spinal injections and SI joint injection.  Symptoms are not completely consistent with classic radiculopathy but do seem to be in line with this.  Plan: -Prior visit notes reviewed in detail and CareEverywhere -Reviewed prior imaging studies from 2019, as well as reports from recent MRI and CT scans, but images were not available for review.  CT scan of the lumbar spine did show moderate to severe neuroforaminal stenosis based on the report, which could contribute to her symptoms -Discussed medication options including gabapentin, amitriptyline, or similar medications.  Patient has not liked the way she felt with these in the past, therefore is not interested in proceeding with these -Discussed given her symptoms, the lumbar surgery is not unreasonable.  She is aware since her symptoms are not completely consistent with classic radiculopathy, that like her neck, all symptoms may not be resolved from surgery  -She will consider her options.  She is awaiting to see if peer to peer with her insurance company is successful to approve the upcoming lumbar fusion surgery.   -She can reach out with any questions or concerns  Patient expressed understanding & agreement with above.  40 mins spent on encounter today including:  review of previous notes & imaging studies available in CareEverywhere, discussion of treatment options as noted above in note, and completing documentation.     Encounter Diagnoses  Name Primary?   Cervical spondylosis with radiculopathy Yes   Degeneration of  intervertebral disc of lumbar region with discogenic back pain and lower extremity pain     No orders of the defined types were placed in this encounter.   No orders of the defined types were placed in this encounter.

## 2023-04-26 ENCOUNTER — Encounter: Payer: Self-pay | Admitting: Family Medicine

## 2023-04-26 DIAGNOSIS — M4722 Other spondylosis with radiculopathy, cervical region: Secondary | ICD-10-CM | POA: Insufficient documentation

## 2023-04-26 NOTE — Assessment & Plan Note (Addendum)
Chronic neck pain with cervical spondylosis and radiculopathy status post C5-C7 lumbar fusion July 2024, radicular symptoms have improved, but still with ongoing neck pain and significant headaches.  Plan: -Prior visit notes reviewed in detail and CareEverywhere -Reviewed prior imaging studies from 2019, as well as reports from recent MRI and CT scans, but images were not available for review -Discussed medication options including gabapentin, amitriptyline, or similar medications.  Patient has not liked the way she felt with these in the past, therefore is not interested in proceeding with these -Discussed given her symptoms, especially with headaches, she could potentially be a candidate for suboccipital/occipital nerve block.  She may want to discuss this further with her specialist.  She can reach out with any questions or concerns

## 2023-04-26 NOTE — Assessment & Plan Note (Signed)
Chronic low back pain with associated lumbar spondylosis and symptoms consistent with radiculopathy.  Limited improvement with prior spinal injections and SI joint injection.  Symptoms are not completely consistent with classic radiculopathy but do seem to be in line with this.

## 2024-05-14 ENCOUNTER — Ambulatory Visit: Admitting: Sports Medicine

## 2024-05-14 VITALS — Ht 64.0 in | Wt 112.0 lb

## 2024-05-14 DIAGNOSIS — Q6672 Congenital pes cavus, left foot: Secondary | ICD-10-CM | POA: Diagnosis not present

## 2024-05-14 DIAGNOSIS — Q6671 Congenital pes cavus, right foot: Secondary | ICD-10-CM | POA: Diagnosis not present

## 2024-05-14 DIAGNOSIS — Q667 Congenital pes cavus, unspecified foot: Secondary | ICD-10-CM | POA: Diagnosis not present

## 2024-05-14 DIAGNOSIS — G43809 Other migraine, not intractable, without status migrainosus: Secondary | ICD-10-CM | POA: Insufficient documentation

## 2024-05-14 MED ORDER — AMITRIPTYLINE HCL 25 MG PO TABS: 25.0000 mg | ORAL_TABLET | Freq: Every day | ORAL | 3 refills | Status: AC

## 2024-05-14 NOTE — Progress Notes (Signed)
 Chief complaint: Bilateral foot and lower extremity pain  Patient has been using a custom orthotic for years.  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 These thin her orthotics only have limited cushion but do hold her high arch in a good position.  The problem is with her forefoot strike she is breaking these down within a year to year and a half.  A second problem but we will address is her known cervical spine disease.  She did have a three-level fusion this past year that has been pretty successful about any weakness into her arms.  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  Physical exam Athletic older female in no acute distress Ht 5' 4 (1.626 m)   Wt 112 lb (50.8 kg)   BMI 19.22 kg/m   Cavus foot shape bilaterally Widening of the forefoot with some callus Standing position is neutral Gait is neutral

## 2024-05-14 NOTE — Assessment & Plan Note (Signed)
 Patient has certainly improved with the use of custom orthotics but is getting too rapid to breakdown in the top we have been using I did some repair on her older orthotics but felt we needed to make her a new one with more cushion and better material  Patient was fitted for a standard, cushioned, semi-rigid orthotic.  The orthotic was heated and the patient stood on the orthotic blank positioned on the orthotic stand. The patient was positioned in subtalar neutral position and 10 degrees of ankle dorsiflexion in a weight bearing stance. After molding, a stable [employer] EVA base was applied to the orthotic blank.   The blank was ground to a stable position for weight bearing. size: 10 fit and run with ethyl vinyl acetate base: Incorporated into the orthotic posting: none additional orthotic padding: none  At completion of orthotic preparation the patient felt good comfort and had a neutral gait

## 2024-05-14 NOTE — Assessment & Plan Note (Signed)
 I gave her a trial of amitriptyline  25 at at bedtime with 3 refills.  If this is not helping we could consider a combination of tramadol and/or gabapentin.  In any case I would like her to monitor her migraine frequency after medication and see if we can improve this

## 2024-05-21 ENCOUNTER — Encounter: Admitting: Sports Medicine

## 2024-06-05 ENCOUNTER — Other Ambulatory Visit: Payer: Self-pay | Admitting: *Deleted

## 2024-06-05 ENCOUNTER — Other Ambulatory Visit: Payer: Self-pay | Admitting: Sports Medicine

## 2024-06-05 MED ORDER — GABAPENTIN 100 MG PO CAPS: 100.0000 mg | ORAL_CAPSULE | Freq: Every day | ORAL | 2 refills | Status: AC
